# Patient Record
Sex: Male | Born: 1960 | Race: White | Hispanic: No | Marital: Single | Smoking: Never smoker
Health system: Southern US, Community
[De-identification: ages and names within clinical notes are randomized; demographics above are authoritative.]

## PROBLEM LIST (undated history)

## (undated) DIAGNOSIS — F329 Major depressive disorder, single episode, unspecified: Secondary | ICD-10-CM

## (undated) DIAGNOSIS — C760 Malignant neoplasm of head, face and neck: Secondary | ICD-10-CM

## (undated) DIAGNOSIS — K219 Gastro-esophageal reflux disease without esophagitis: Secondary | ICD-10-CM

## (undated) DIAGNOSIS — F32A Depression, unspecified: Secondary | ICD-10-CM

## (undated) DIAGNOSIS — I10 Essential (primary) hypertension: Secondary | ICD-10-CM

## (undated) DIAGNOSIS — T884XXA Failed or difficult intubation, initial encounter: Secondary | ICD-10-CM

## (undated) DIAGNOSIS — C109 Malignant neoplasm of oropharynx, unspecified: Secondary | ICD-10-CM

## (undated) DIAGNOSIS — J189 Pneumonia, unspecified organism: Secondary | ICD-10-CM

## (undated) HISTORY — DX: Malignant neoplasm of oropharynx, unspecified: C10.9

## (undated) HISTORY — PX: TONSILLECTOMY: SUR1361

## (undated) HISTORY — PX: APPENDECTOMY: SHX54

## (undated) HISTORY — PX: RADICAL NECK DISSECTION: SHX2284

## (undated) HISTORY — PX: ADENOIDECTOMY: SUR15

## (undated) HISTORY — DX: Malignant neoplasm of head, face and neck: C76.0

---

## 2007-03-01 ENCOUNTER — Ambulatory Visit: Payer: Self-pay | Admitting: Unknown Physician Specialty

## 2007-03-15 ENCOUNTER — Ambulatory Visit: Payer: Self-pay | Admitting: Oncology

## 2007-04-14 ENCOUNTER — Ambulatory Visit: Payer: Self-pay | Admitting: Oncology

## 2007-05-14 ENCOUNTER — Ambulatory Visit: Payer: Self-pay | Admitting: Oncology

## 2007-07-08 ENCOUNTER — Emergency Department: Payer: Self-pay | Admitting: Emergency Medicine

## 2008-12-14 ENCOUNTER — Ambulatory Visit: Payer: Self-pay | Admitting: Oncology

## 2009-01-07 ENCOUNTER — Ambulatory Visit: Payer: Self-pay | Admitting: Oncology

## 2009-01-11 ENCOUNTER — Ambulatory Visit: Payer: Self-pay | Admitting: Oncology

## 2009-01-26 ENCOUNTER — Ambulatory Visit: Payer: Self-pay | Admitting: Oncology

## 2009-01-27 ENCOUNTER — Ambulatory Visit: Payer: Self-pay | Admitting: Unknown Physician Specialty

## 2009-02-01 ENCOUNTER — Ambulatory Visit: Payer: Self-pay | Admitting: Oncology

## 2009-02-11 ENCOUNTER — Ambulatory Visit: Payer: Self-pay | Admitting: Oncology

## 2009-03-06 ENCOUNTER — Emergency Department: Payer: Self-pay | Admitting: Internal Medicine

## 2009-04-04 ENCOUNTER — Emergency Department: Payer: Self-pay | Admitting: Emergency Medicine

## 2009-05-05 ENCOUNTER — Ambulatory Visit: Payer: Self-pay | Admitting: Unknown Physician Specialty

## 2009-11-03 ENCOUNTER — Emergency Department: Payer: Self-pay | Admitting: Emergency Medicine

## 2009-11-24 ENCOUNTER — Ambulatory Visit: Payer: Self-pay | Admitting: Dermatology

## 2009-12-28 ENCOUNTER — Ambulatory Visit: Payer: Self-pay | Admitting: Podiatry

## 2009-12-30 ENCOUNTER — Ambulatory Visit: Payer: Self-pay | Admitting: Podiatry

## 2010-01-06 ENCOUNTER — Ambulatory Visit: Payer: Self-pay | Admitting: Podiatry

## 2014-10-06 ENCOUNTER — Ambulatory Visit: Payer: Self-pay | Admitting: Internal Medicine

## 2014-10-28 ENCOUNTER — Encounter (INDEPENDENT_AMBULATORY_CARE_PROVIDER_SITE_OTHER): Payer: Self-pay

## 2014-10-28 ENCOUNTER — Encounter: Payer: Self-pay | Admitting: Internal Medicine

## 2014-10-28 ENCOUNTER — Ambulatory Visit (INDEPENDENT_AMBULATORY_CARE_PROVIDER_SITE_OTHER): Payer: BC Managed Care – PPO | Admitting: Internal Medicine

## 2014-10-28 VITALS — BP 128/78 | HR 92 | Temp 98.0°F | Ht 75.0 in | Wt 216.0 lb

## 2014-10-28 DIAGNOSIS — R0689 Other abnormalities of breathing: Secondary | ICD-10-CM

## 2014-10-28 DIAGNOSIS — R0600 Dyspnea, unspecified: Secondary | ICD-10-CM

## 2014-10-28 MED ORDER — ALBUTEROL SULFATE HFA 108 (90 BASE) MCG/ACT IN AERS: 2.0000 | INHALATION_SPRAY | RESPIRATORY_TRACT | Status: DC | PRN

## 2014-10-28 NOTE — Patient Instructions (Signed)
We will order a CT neck and chest, a breathing test and a walk test.  We will send prescription of Albuterol to use 2-4 hours as needed for shortness of breath. We will schedule a 1 month follow up.

## 2014-10-28 NOTE — Progress Notes (Signed)
Date: 10/28/2014  MRN# [id number] Randall Hunter 1961-07-28  Referring Physician: Dr. Delight Ovens Randall Hunter is a 53 y.o. old male seen in consultation for dyspnea on exertion  CC: "short of breath, especially with exercise" Chief Complaint  Patient presents with  . 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 Tami Ribas for shortness of breath. Patient is accompanied by his fiance today. Patient states that he is a fairly active gentleman running and exercising 4 times per week however over the last 4 months this has become more difficult due to shortness of breath with exertion.  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 In 2003 he was noted to have radiation to the left neck and possibly upper lung fields, no radiation in 2014. He states that he has been fairly healthy other than his and malignancy of the neck and left tonsil. In addition to dyspnea on exertion, he also complains of some difficulty swallowing, which he attributes to his 2 neck surgeries and radiation to the neck. Patient states her shortness of breath mostly with exertion, which she describes as inability to get enough air, associated with wheezing, and chest heaviness. His primary care physician did perform a stress echo on him which showed no abnormalities. He also carries a diagnosis of acid reflux for which she is on Protonix 40 mg daily. Shortness of breath occurs mostly with exercising and after exercise the majority of the time over the past 4 months. He was seen by ENT, Dr. Tami Ribas who performed a laryngoscopy that showed sluggish cord movement, not new, left slightly worse than the right. Her level of dyspnea, it  was not felt that the vocal cords were the only reason for dyspnea on exertion, he was referred to pulmonary for further workup and evaluation. He denies any environmental allergies such as dust, pollen, cat dander. 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 He denies any travel to areas with a high prevalence TB or any other airborne diseases. Patient also denies any swelling of the legs and arms. Dr. Tami Ribas prescribed him a trial of Symbicort, which was given on 10/22/2014, currently patient does not feel any significant difference in his breathing. Patient's last neck CT was in 2013, see results as stated below. Patient's major complaint with dyspnea on exertion today is stating that 4 months ago he was able to right and perform exercises on a daily routine without any significant dyspnea currently now performing more than 2 daily chores makes him feel very tired and dyspneic.   PMHX:   Past Medical History  Diagnosis Date  . Oropharyngeal carcinoma     lef sided oropharyngeal SCC s\p resection\radiation  . Malignant tumor of neck     left sided cervical SCC s\p resection\radion, 2014 revision surgery    Surgical Hx:  Past Surgical History  Procedure Laterality Date  . Tonsillectomy    . Adenoidectomy    . Appendectomy    . Radical neck dissection  2002 and 2014    left cervical neck   Family Hx:  History reviewed. No pertinent family history. Social Hx:   History  Substance Use  Topics  . Smoking status: Never Smoker   . Smokeless tobacco: Never Used  . Alcohol Use: No   Medication:   Current Outpatient Rx  Name  Route  Sig  Dispense  Refill  . albuterol (PROVENTIL HFA;VENTOLIN HFA) 108 (90 BASE) MCG/ACT inhaler   Inhalation   Inhale 2 puffs into the lungs every 4 (four) hours as needed for wheezing or shortness of breath.   1 Inhaler   2   . pantoprazole (PROTONIX) 40 MG tablet   Oral   Take 40 mg by mouth 2 (two) times daily.          Marland Kitchen PARoxetine (PAXIL) 20 MG tablet   Oral   Take 20 mg by mouth daily.             Allergies:  Review of patient's allergies indicates no known allergies.  Review of Systems: Gen:  Denies  fever, sweats, chills HEENT: Denies blurred vision, double vision, ear pain, eye pain, hearing loss, nose bleeds, sore throat Cvc:  No dizziness, chest pain or heaviness Resp:   Admits to dyspnea on exertion, and mild intermittent dry cough Gi: Denies swallowing difficulty, stomach pain, nausea or vomiting, diarrhea, constipation, bowel incontinence Gu:  Denies bladder incontinence, burning urine Ext:   No Joint pain, stiffness or swelling Skin: No skin rash, easy bruising or bleeding or hives Endoc:  No polyuria, polydipsia , polyphagia or weight change Psych: No depression, insomnia or hallucinations  Other:  All other systems negative  Physical Examination:   VS: BP 128/78 mmHg  Pulse 92  Temp(Src) 98 F (36.7 C) (Oral)  Ht 6\' 3"  (1.905 m)  Wt 216 lb (97.977 kg)  BMI 27.00 kg/m2  SpO2 96%  General Appearance: No distress  Neuro:without focal findings, mental status, speech normal, alert and oriented, cranial nerves 2-12 intact, reflexes normal and symmetric, sensation grossly normal  HEENT: PERRLA, EOM intact, no ptosis, no other lesions noticed; Mallampati 3 Pulmonary: normal breath sounds., diaphragmatic excursion normal.No wheezing, No rales;   Sputum Production:   CardiovascularNormal S1,S2.  No m/r/g.  Abdominal aorta pulsation normal.    Abdomen: Benign, Soft, non-tender, No masses, hepatosplenomegaly, No lymphadenopathy Renal:  No costovertebral tenderness  GU:  No performed at this time. Endoc: No evident thyromegaly, no signs of acromegaly or Cushing features Skin:   warm, no rashes, no ecchymosis. Left neck with heal scar Extremities: normal, no cyanosis, clubbing, no edema, warm with normal capillary refill. Other findings:none    Rad results:  NECK CT EXAM DATE:  11/22/12 13:12:00  EXAM: Computed tomography, soft tissue neck with contrast material.  DICTATED: 11/22/12 13:31:09  CLINICAL INDICATION: 53 year old (M) with left neck skin cancer surgery  planning. History of a left tonsil squamous cell carcinoma diagnosed in 2012.  He underwent surgery which included a radical tonsillectomy and a radical left  neck dissection , post op radiation in early 2003.   COMPARISON: None available.  TECHNIQUE: Axial 3-mm images from the skull base through the thoracic inlet  after the administration of intravenous contrast. Coronal and sagittal  reformatted images, bone and soft tissue algorithm are provided. For all [facility]  CT exams, radiation dose reduction device (automated exposure control) is used  or manual techniques with radiation dose As Low As Reasonably Achievable  (ALARA) protocol are followed using age and patient-size-specific scan  parameters, while maintaining the necessary diagnostic image quality.  FINDINGS: Disease status post left tonsillectomy and left radical neck  dissection.  The  visualized portions of the brain and the posterior fossa are normal.  The paranasal sinuses, orbits, nasopharynx, oropharynx, and oral cavity are  normal. The salivary glands, including the parotid glands and submandibular  glands, are normal.  The larynx, hypopharynx, and thyroid gland are normal.  There is no lymphadenopathy.  No bone abnormality is demonstrated. There are fibrotic changes noted in the  bilateral lung apices. Normal intravascular enhancement is seen.  INTERPRETATION LOCATION: [place] post left tonsillectomy and radical left neck dissection. No  cervical mass or lymphadenopathy noted.      Stress ECHO 10/16/14 INTERPRETATION Normal Stress Echocardiogram Adequate cardiac workload  Assessment and Plan: Dyspnea and respiratory abnormality Differential diagnosis include: Recurrence of the neck tumor,  focalcord dysfunction, tracheomalacia, radiation fibrosis, obstructive pulmonary disease, restrictive coronary disease  Patient dyspnea on exertion is concerning, especially given normal cardiac stress test. Radiation fibrosis usually occurs in one to 2 years status post last radiation dose, however patient is about 12 years since last radiation dose. While radiation fibrosis is unlikely, cannot be completely excluded from the differential diagnosis.  Tracheomalacia also falls into this category.  Plan - high-resolution CAT scan, with dynamic inspiratory and expiratory views in both supine and recumbent position. - check full PFTs and, 24mwt - albuterol 2 puffs as needed for shortness of breath and wheezing.    Updated Medication List Outpatient Encounter Prescriptions as of 10/28/2014  Medication Sig  . pantoprazole (PROTONIX) 40 MG tablet Take 40 mg by mouth 2 (two) times daily.  Marland Kitchen PARoxetine (PAXIL) 20 MG tablet Take 20 mg by mouth daily.    Orders for this visit: Orders Placed This Encounter  Procedures  . CT Soft Tissue Neck Wo Contrast    Standing Status: Future     Number of Occurrences:      Standing Expiration Date: 01/27/2016    Scheduling Instructions:     Please schedule @ [facility] Dr. Hannah Beat to read.    Order Specific Question:  Reason for Exam (SYMPTOM  OR DIAGNOSIS REQUIRED)    Answer:  cough, sob    Order Specific Question:  Preferred imaging location?    Answer:  [facility]  . CT Chest High Resolution    Standing Status: Future     Number of Occurrences:      Standing Expiration Date: 12/30/2015    Scheduling Instructions:     Schedule High Res CT chest without contrast @ [facility] Dr. Hannah Beat to read. Dx sob, cough    Order Specific Question:  Reason for Exam (SYMPTOM  OR DIAGNOSIS REQUIRED)    Answer:  sob, cough    Order Specific Question:  Preferred imaging location?    Answer:  [facility]  . Pulmonary function test    Standing Status: Future      Number of Occurrences:      Standing Expiration Date: 10/29/2015    Scheduling Instructions:     Scheduled in [place] office 11/10/14    Order Specific Question:  Where should this test be performed?    Answer:  [facility]    Order Specific Question:  Full PFT: includes the following: basic spirometry, spirometry pre & post bronchodilator, diffusion capacity (DLCO), lung volumes    Answer:  Full PFT    Order Specific Question:  MIP/MEP    Answer:  No    Order Specific Question:  6 minute walk    Answer:  Yes    Order Specific Question:  ABG  Answer:  No    Order Specific Question:  Diffusion capacity (DLCO)    Answer:  No    Order Specific Question:  Lung volumes    Answer:  No    Order Specific Question:  Methacholine challenge    Answer:  No     Thank  you for the consultation and for allowing [facility] to assist in the care of your patient. Our recommendations are noted above.  Please contact us if we can be of further service.   Vilinda Boehringer, MD [facility] Office Number: [phone]

## 2014-10-29 ENCOUNTER — Encounter: Payer: Self-pay | Admitting: Internal Medicine

## 2014-10-29 ENCOUNTER — Telehealth: Payer: Self-pay | Admitting: Internal Medicine

## 2014-10-29 MED ORDER — ALBUTEROL SULFATE HFA 108 (90 BASE) MCG/ACT IN AERS: 2.0000 | INHALATION_SPRAY | RESPIRATORY_TRACT | Status: DC | PRN

## 2014-10-29 NOTE — Assessment & Plan Note (Signed)
Differential diagnosis include: Recurrence of the neck tumor, focalcord dysfunction, tracheomalacia, radiation fibrosis, obstructive pulmonary disease, restrictive coronary disease  Patient dyspnea on exertion is concerning, especially given normal cardiac stress test. Radiation fibrosis usually occurs in one to 2 years status post last radiation dose, however patient is about 12 years since last radiation dose. While radiation fibrosis is unlikely, cannot be completely excluded from the differential diagnosis.  Tracheomalacia also falls into this category.  Plan - high-resolution CAT scan, with dynamic inspiratory and expiratory views in both supine and recumbent position. - check full PFTs and, 81mwt - albuterol 2 puffs as needed for shortness of breath and wheezing.

## 2014-10-29 NOTE — Telephone Encounter (Signed)
Called pt. He reports the albuterol was not called in. i have sent this in. Nothing further needed

## 2014-10-30 ENCOUNTER — Telehealth: Payer: Self-pay | Admitting: Internal Medicine

## 2014-10-30 NOTE — Telephone Encounter (Signed)
Spoke with pt, states that he was told by [facility] that he needed a PA for his proventil.  [place], pharmacist states that his insurance requires a PA for any albuterol inhaler, no particular one is covered or recommended.  Had PA form faxed to our office, will look out for this.

## 2014-11-02 NOTE — Telephone Encounter (Signed)
This has not yet been received.

## 2014-11-02 NOTE — Telephone Encounter (Signed)
Caryl Pina please advise if you have received this form for the PA to be done. thanks

## 2014-11-04 ENCOUNTER — Ambulatory Visit: Payer: Self-pay | Admitting: Internal Medicine

## 2014-11-04 NOTE — Telephone Encounter (Signed)
Called [employer] and asked them to send form for PA.  Received fax, given to [facility] to put in Dr. Merian Capron file for signature.

## 2014-11-04 NOTE — Telephone Encounter (Signed)
Dr. Stevenson Clinch I have PA form for completion for patient Albuterol. [employer] will not cover Proventil, would you consider switching to another medication or wait until Monday?

## 2014-11-08 NOTE — Telephone Encounter (Signed)
Ventolin is okay, if ins will cover

## 2014-11-09 ENCOUNTER — Other Ambulatory Visit: Payer: Self-pay | Admitting: Internal Medicine

## 2014-11-09 DIAGNOSIS — R0689 Other abnormalities of breathing: Principal | ICD-10-CM

## 2014-11-09 DIAGNOSIS — R0600 Dyspnea, unspecified: Secondary | ICD-10-CM

## 2014-11-09 MED ORDER — ALBUTEROL SULFATE (2.5 MG/3ML) 0.083% IN NEBU: INHALATION_SOLUTION | RESPIRATORY_TRACT | Status: DC

## 2014-11-09 NOTE — Telephone Encounter (Signed)
Patient scheduled for PFT tomorrow, script for nebulizer printed and will be given to patient tomorrow. Prescription for albuterol for neb sent to patient pharmacy. I will talk with him about the importance of having a rescue inhaler on hand.

## 2014-11-09 NOTE — Telephone Encounter (Signed)
Nebulizer albuterol is okay. 1neb treatment q2-4hrs, prn sob\wheezing.  Please note that if the patient is out of his home and experiences sob\wheezing, he will need a rescue inhaler.  I understand that it is expensive, but it is a recommendation that I endorse highly (again, only as a rescue inhaler, 2puff q35mins prn sob\wheezing, max 3 consecutive uses, if no relief then seek medical assistance).

## 2014-11-09 NOTE — Telephone Encounter (Signed)
Spoke with pharmacist at [facility], ventolin and proventil are not covered by pt's insurance.  Proair is covered but will cost pt $52 per inhaler.  Pharmacist notes another option for pt is a nebulized albuterol, being covered at $4/box.    Dr. Stevenson Clinch please advise.  Thanks!

## 2014-11-10 ENCOUNTER — Ambulatory Visit (INDEPENDENT_AMBULATORY_CARE_PROVIDER_SITE_OTHER): Payer: BLUE CROSS/BLUE SHIELD | Admitting: Internal Medicine

## 2014-11-10 ENCOUNTER — Ambulatory Visit: Payer: BC Managed Care – PPO | Admitting: Internal Medicine

## 2014-11-10 ENCOUNTER — Other Ambulatory Visit: Payer: BLUE CROSS/BLUE SHIELD

## 2014-11-10 DIAGNOSIS — R0600 Dyspnea, unspecified: Secondary | ICD-10-CM

## 2014-11-10 DIAGNOSIS — R0689 Other abnormalities of breathing: Secondary | ICD-10-CM

## 2014-11-10 LAB — PULMONARY FUNCTION TEST
DL/VA % pred: 83 %
DL/VA: 4.09 ml/min/mmHg/L
DLCO unc % pred: 76 %
DLCO unc: 30.07 ml/min/mmHg
FEF 25-75 POST: 3.39 L/s
FEF 25-75 PRE: 4.1 L/s
FEF2575-%CHANGE-POST: -17 %
FEF2575-%Pred-Post: 92 %
FEF2575-%Pred-Pre: 111 %
FEV1-%CHANGE-POST: -6 %
FEV1-%PRED-PRE: 96 %
FEV1-%Pred-Post: 90 %
FEV1-PRE: 4.27 L
FEV1-Post: 3.97 L
FEV1FVC-%Change-Post: -8 %
FEV1FVC-%Pred-Pre: 100 %
FEV6-%CHANGE-POST: 2 %
FEV6-%PRED-POST: 101 %
FEV6-%Pred-Pre: 99 %
FEV6-POST: 5.62 L
FEV6-Pre: 5.5 L
FEV6FVC-%Change-Post: 0 %
FEV6FVC-%Pred-Post: 104 %
FEV6FVC-%Pred-Pre: 103 %
FVC-%CHANGE-POST: 1 %
FVC-%PRED-POST: 97 %
FVC-%Pred-Pre: 95 %
FVC-POST: 5.62 L
PRE FEV1/FVC RATIO: 77 %
Post FEV1/FVC ratio: 71 %
Post FEV6/FVC ratio: 100 %
Pre FEV6/FVC Ratio: 100 %

## 2014-11-10 NOTE — Progress Notes (Signed)
PFT done today. 

## 2014-11-26 ENCOUNTER — Ambulatory Visit: Payer: BC Managed Care – PPO | Admitting: Internal Medicine

## 2014-11-26 ENCOUNTER — Encounter: Payer: Self-pay | Admitting: Internal Medicine

## 2014-11-26 ENCOUNTER — Ambulatory Visit (INDEPENDENT_AMBULATORY_CARE_PROVIDER_SITE_OTHER): Payer: BLUE CROSS/BLUE SHIELD | Admitting: Internal Medicine

## 2014-11-26 ENCOUNTER — Telehealth: Payer: Self-pay | Admitting: *Deleted

## 2014-11-26 VITALS — BP 126/84 | HR 73 | Ht 75.0 in | Wt 219.0 lb

## 2014-11-26 DIAGNOSIS — R0600 Dyspnea, unspecified: Secondary | ICD-10-CM

## 2014-11-26 DIAGNOSIS — R0689 Other abnormalities of breathing: Secondary | ICD-10-CM

## 2014-11-26 NOTE — Assessment & Plan Note (Signed)
Differential diagnosis include: Recurrence of the neck tumor, focalcord dysfunction, tracheomalacia, radiation fibrosis, obstructive pulmonary disease, restrictive coronary disease  Patient dyspnea on exertion is concerning, especially given normal cardiac stress test. Radiation fibrosis usually occurs in one to 2 years status post last radiation dose, however patient is about 12 years since last radiation dose. While radiation fibrosis is unlikely, cannot be completely excluded from the differential diagnosis.  Tracheomalacia also falls into this category.  CT of chest and neck in December 2015 showed no recurrence of tumor or metastasis to the lungs. However, there was noted to be biapical pleural thickening architectural distortion consistent with chronic postradiation changes.  PFTs also showed an obstructive process, however, there was mild to moderate restriction.  Plan: -Given that he is having dyspnea with exertion and no recurrence of tumor or no lung fibrosis on imaging,tracheomalacia is still high in differential. We discussed fiberoptic bronchoscopy at today's visit to evaluate for live evaluation of the trachea during the respiratory cycle and any dynamic collapse during expiration. Patient is in agreement with bronchoscopy, also discussed if there is any endobronchial lesions noted the bronchoscopy will plan for fine needle biopsy. -Continue with as needed albuterol nebulizer for shortness of breath. -Is no significant dynamic collapse as noted on fiberoptic bronchoscopy will then speak to his ENT physician about seeing a laryngeal/vocal cord specialist.

## 2014-11-26 NOTE — Progress Notes (Signed)
MRN# [id number] Randall Hunter 1961-09-08   CC: Chief Complaint  Patient presents with  . Follow-up    Pt only has nebulizer txt. His ins will not cover medications prescribed. He is not able to take a breathing txt every 2-4 hours and has not seen a change in his breathing after a txt. He is still having lots of sob.      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 Tami Hunter for shortness of breath. Patient states that he is a fairly active gentleman running and exercising 4 times per week however over the last 4 months this has become more difficult due to shortness of breath with exertion. 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 In 2003 he was noted to have radiation to the left neck and possibly upper lung fields, no radiation in 2014.  Repeat CT Chest and Neck with no cancer recurrence or metastasis to the lungs, did note to have b\l apical fibrosis (mild) from prior radiation   Events since last clinic visit: Patient is a pleasant 54 year old male presents today for followup visit of shortness of breath with exertion, he is accompanied by his spouse. Briefly, patient is a history of squamous cell carcinoma of the neck status post resection and radiation in 2002 in 2003, with tumor recurrence in the left neck and revision surgery in 2014. Today patient states that his shortness of breath is still present but has not gotten worse since his last visit, again he is noted to have shortness of breath mostly with exertion. At his last visit the plan was to have pulmonary function testing CT chest, CT neck and followup visit. Today patient request the results of his studies.    PMHX:   Past  Medical History  Diagnosis Date  . Oropharyngeal carcinoma     lef sided oropharyngeal SCC s\p resection\radiation  . Malignant tumor of neck     left sided cervical SCC s\p resection\radion, 2014 revision surgery    Surgical Hx:  Past Surgical History  Procedure Laterality Date  . Tonsillectomy    . Adenoidectomy    . Appendectomy    . Radical neck dissection  2002 and 2014    left cervical neck   Family Hx:  No family history on file. Social Hx:   History  Substance Use Topics  . Smoking status: Never Smoker   . Smokeless tobacco: Never Used  . Alcohol Use: No   Medication:   Current Outpatient Rx  Name  Route  Sig  Dispense  Refill  . albuterol (PROVENTIL) (2.5 MG/3ML) 0.083% nebulizer solution      Take 2.5 MG/ML every 2-4 hours prn sob/wheezing   75 mL   12   . pantoprazole (PROTONIX) 40 MG tablet   Oral   Take 40 mg by mouth 2 (two) times daily.         Marland Kitchen PARoxetine (PAXIL) 20 MG tablet   Oral   Take 20 mg by mouth daily.            Review of Systems: Gen:  Denies  fever, sweats, chills HEENT: Denies blurred vision, double vision, ear pain, eye pain, hearing loss, nose bleeds,  sore throat Cvc:  No dizziness, chest pain or heaviness Resp: shortness of breath not worst from last visit, mostly with exertion Gi: Denies swallowing difficulty, stomach pain, nausea or vomiting, diarrhea, constipation, bowel incontinence Gu:  Denies bladder incontinence, burning urine Ext:   No Joint pain, stiffness or swelling Skin: No skin rash, easy bruising or bleeding or hives Endoc:  No polyuria, polydipsia , polyphagia or weight change Psych: No depression, insomnia or hallucinations  Other:  All other systems negative  Allergies:  Review of patient's allergies indicates no known allergies.  Physical Examination:  VS: BP 126/84 mmHg  Pulse 73  Ht 6\' 3"  (1.905 m)  Wt 219 lb (99.338 kg)  BMI 27.37 kg/m2  SpO2 98%  General Appearance: No distress  HEENT:  PERRLA, EOM intact, no ptosis, no other lesions noticed Pulmonary:Exam: normal breath sounds., diaphragmatic excursion normal.No wheezing, No rales   Cardiovascular:@ Exam:  Normal S1,S2.  No m/r/g.     Abdomen:Exam: Benign, Soft, non-tender, No masses  Skin:   warm, no rashes, no ecchymosis  Extremities: normal, no cyanosis, clubbing, no edema, warm with normal capillary refill.   Labs results:  BMP No results found for: NA, K, CL, CO2, GLUCOSE, BUN, CREATININE   CBC No flowsheet data found.   Rad results:  (The following images and results were reviewed by Dr. Stevenson Hunter). CT Neck 11/04/14 1. No evidence of recurrent mass or enlarged lymph nodes in the neck allowing for lack of IV contrast. 2. Submandibular gland and left sternocleidomastoid muscle are absent. There is fatty infiltration of the right submandibular gland, possibly related to prior radiation therapy.   CT Chest 11/04/14 1. No findings to suggest metastatic disease to the thorax at this time. 2. no findings to suggest interstitial lung disease.  3. mild postradiation changes in the apices of the lungs bilaterally and slightly noted. 4.thickening years of groundglass attenuation, subpleural reticulation, parenchymal banding, traction bronchiectasis or frank honeycombing to suggest interstitial lung disease. Inspiratory and expiratory imaging is unremarkable.   Assessment and Plan: Dyspnea and respiratory abnormality Differential diagnosis include: Recurrence of the neck tumor, focalcord dysfunction, tracheomalacia, radiation fibrosis, obstructive pulmonary disease, restrictive coronary disease  Patient dyspnea on exertion is concerning, especially given normal cardiac stress test. Radiation fibrosis usually occurs in one to 2 years status post last radiation dose, however patient is about 12 years since last radiation dose. While radiation fibrosis is unlikely, cannot be completely excluded from the differential diagnosis.   Tracheomalacia also falls into this category.  CT of chest and neck in December 2015 showed no recurrence of tumor or metastasis to the lungs. However, there was noted to be biapical pleural thickening architectural distortion consistent with chronic postradiation changes.  PFTs also showed an obstructive process, however, there was mild to moderate restriction.  Plan: -Given that he is having dyspnea with exertion and no recurrence of tumor or no lung fibrosis on imaging,tracheomalacia is still high in differential. We discussed fiberoptic bronchoscopy at today's visit to evaluate for live evaluation of the trachea during the respiratory cycle and any dynamic collapse during expiration. Patient is in agreement with bronchoscopy, also discussed if there is any endobronchial lesions noted the bronchoscopy will plan for fine needle biopsy. -Continue with as needed albuterol nebulizer for shortness of breath. -Is no significant dynamic collapse as noted on fiberoptic bronchoscopy will then speak to his ENT physician about seeing a laryngeal/vocal cord specialist.      Updated Medication List Outpatient Encounter Prescriptions as of  11/26/2014  Medication Sig  . albuterol (PROVENTIL) (2.5 MG/3ML) 0.083% nebulizer solution Take 2.5 MG/ML every 2-4 hours prn sob/wheezing  . pantoprazole (PROTONIX) 40 MG tablet Take 40 mg by mouth 2 (two) times daily.  Marland Kitchen PARoxetine (PAXIL) 20 MG tablet Take 20 mg by mouth daily.  . [DISCONTINUED] albuterol (PROVENTIL HFA;VENTOLIN HFA) 108 (90 BASE) MCG/ACT inhaler Inhale 2 puffs into the lungs every 4 (four) hours as needed for wheezing or shortness of breath. (Patient not taking: Reported on 11/26/2014)    Orders for this visit: No orders of the defined types were placed in this encounter.    Thank  you for the visitation and for allowing  [facility] to assist in the care of your patient. Our recommendations are noted above.  Please contact  us if we can be of further service.  Vilinda Boehringer, MD [facility] Office Number: [phone]

## 2014-11-26 NOTE — Patient Instructions (Signed)
We will call you to set up Bronch at [facility]. We will then set up a 2 week follow up after the procedure.

## 2014-11-26 NOTE — Telephone Encounter (Signed)
Called patient with appt info. Bronchoscopy scheduled for next Tuesday 12/01/14 at 10 am. Pt is to stop asa 5 days prior if taking and npo after midnight. Pt will need 2 week f/u appt. He was driving so I will put in recall.

## 2014-12-01 ENCOUNTER — Ambulatory Visit: Payer: Self-pay | Admitting: Internal Medicine

## 2014-12-01 DIAGNOSIS — R0602 Shortness of breath: Secondary | ICD-10-CM

## 2014-12-22 ENCOUNTER — Ambulatory Visit: Payer: BLUE CROSS/BLUE SHIELD | Admitting: Internal Medicine

## 2015-01-11 ENCOUNTER — Ambulatory Visit: Payer: BLUE CROSS/BLUE SHIELD | Admitting: Internal Medicine

## 2015-01-11 ENCOUNTER — Encounter: Payer: Self-pay | Admitting: Internal Medicine

## 2015-10-22 ENCOUNTER — Other Ambulatory Visit: Payer: Self-pay | Admitting: Internal Medicine

## 2015-10-22 ENCOUNTER — Ambulatory Visit
Admission: RE | Admit: 2015-10-22 | Discharge: 2015-10-22 | Disposition: A | Discharge status: Home / Self Care | Payer: No Typology Code available for payment source | Source: Ambulatory Visit | Attending: Internal Medicine | Admitting: Internal Medicine

## 2015-10-22 DIAGNOSIS — R079 Chest pain, unspecified: Secondary | ICD-10-CM | POA: Diagnosis present

## 2015-10-22 DIAGNOSIS — Z9889 Other specified postprocedural states: Secondary | ICD-10-CM

## 2015-10-22 DIAGNOSIS — R070 Pain in throat: Secondary | ICD-10-CM

## 2015-12-31 IMAGING — CR DG NECK SOFT TISSUE
1 series · 3 of 3 positions shown · non-contrast
Comparison: CT scan of November 04, 2014.

CLINICAL DATA: Throat pain.

EXAM:
NECK SOFT TISSUES - 1+ VIEW

[Series 1: dg neck soft tissue · 0.14mm/px · 3 of 3 slices shown]
[im 1/3]
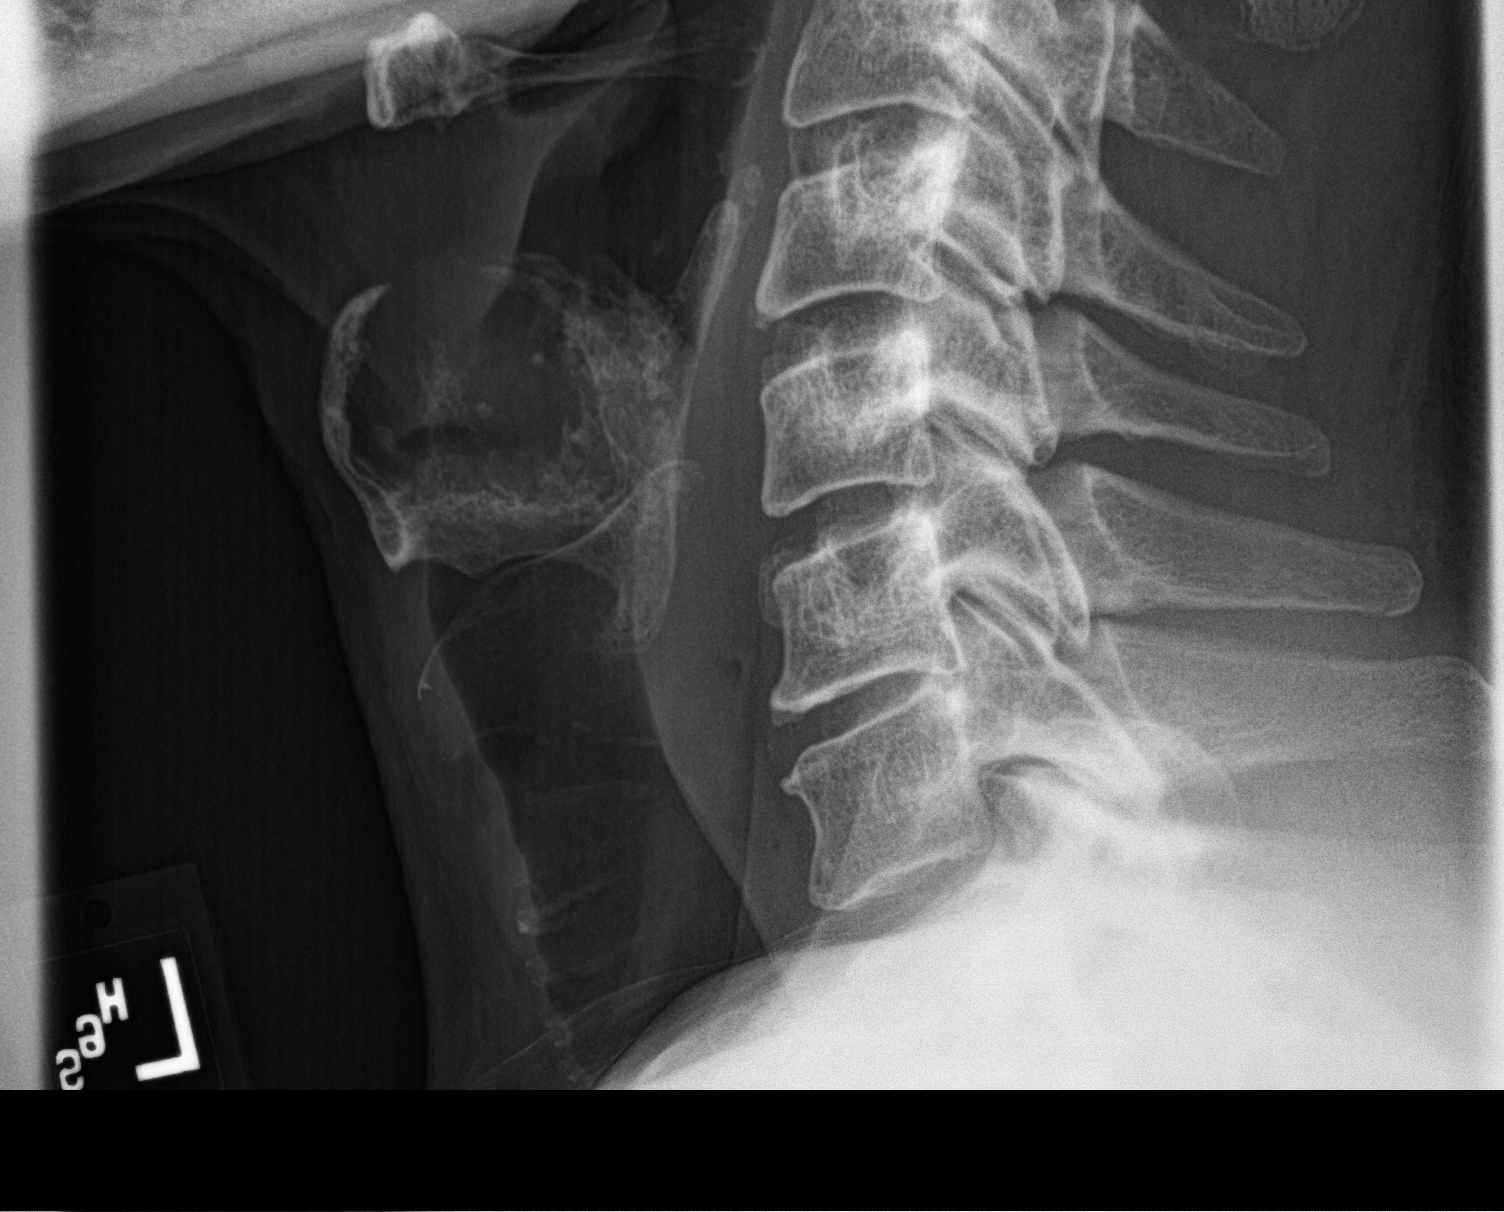
[im 2/3]
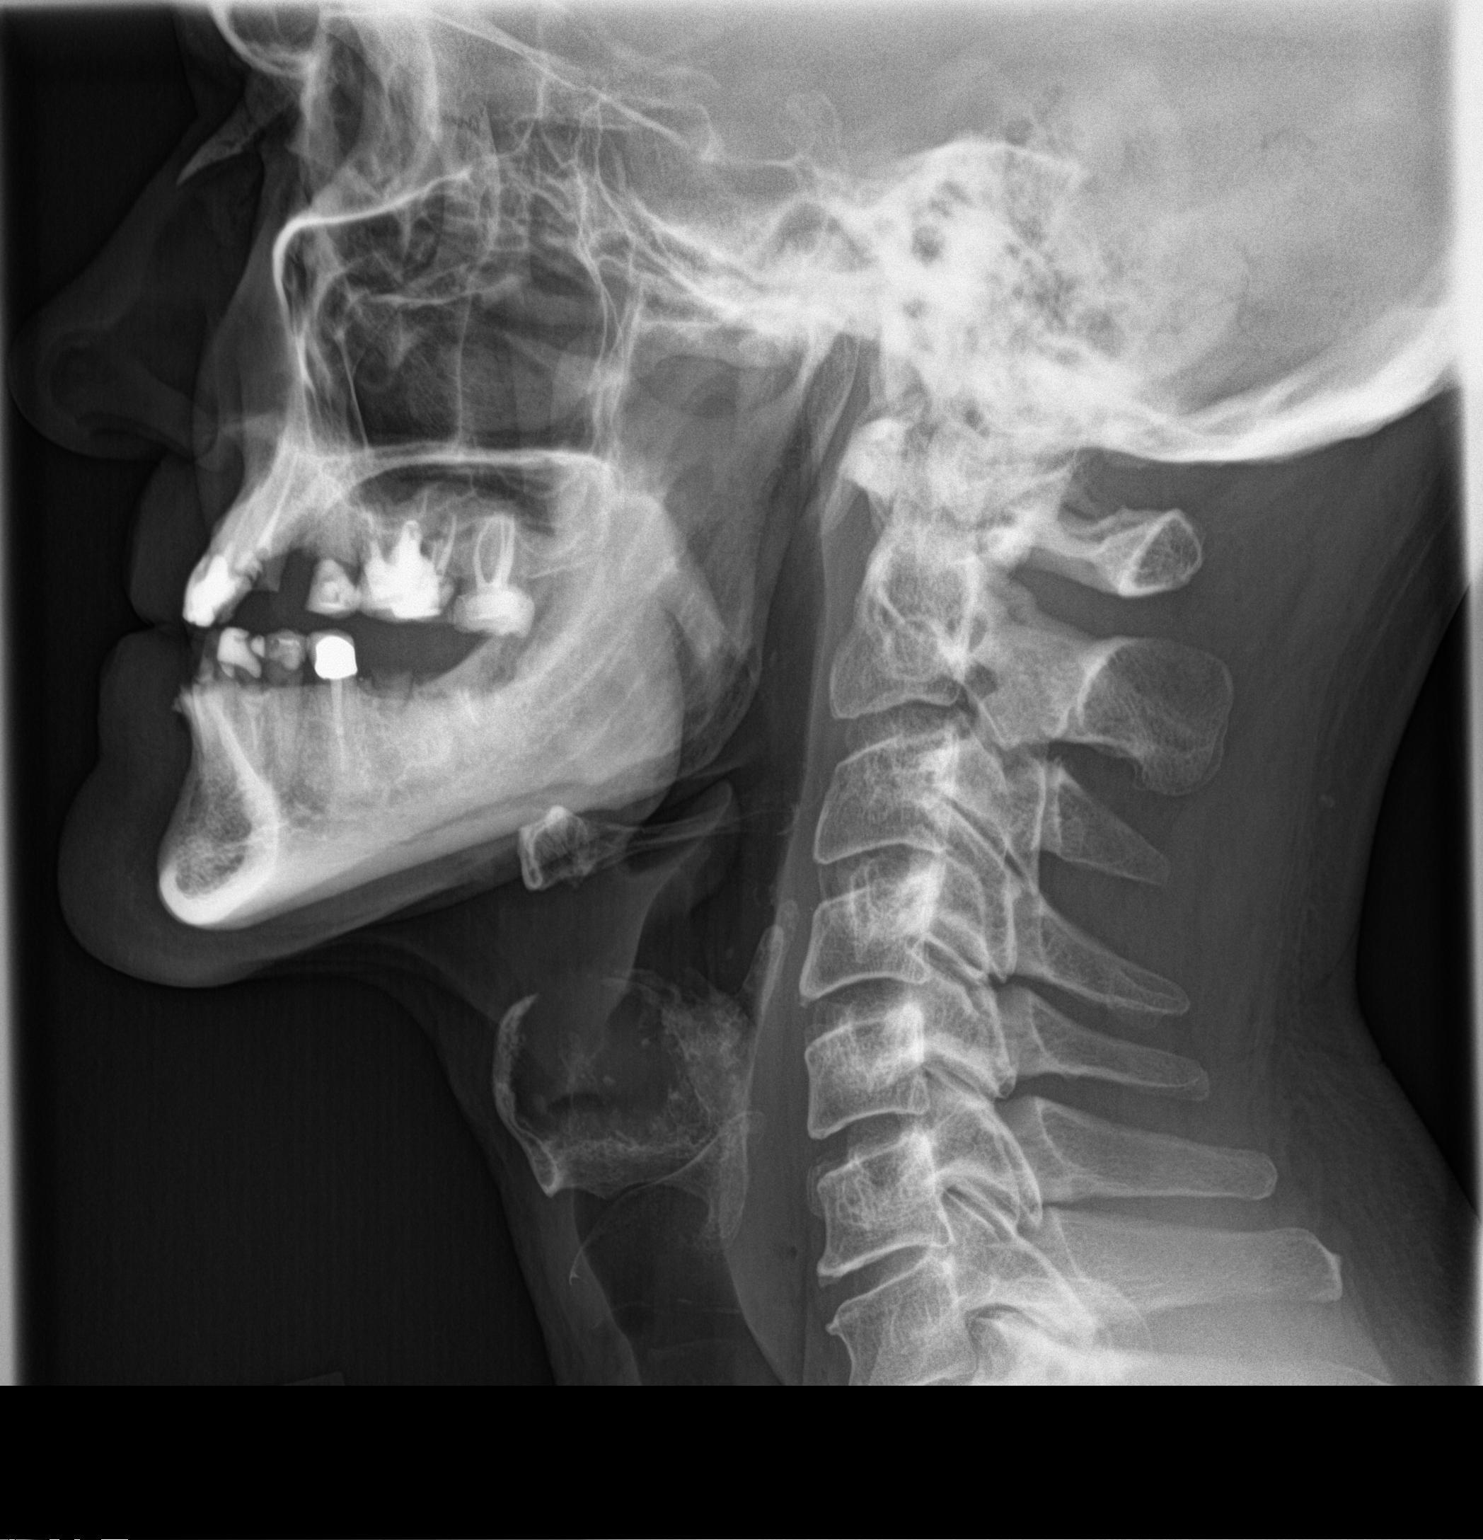
[im 3/3]
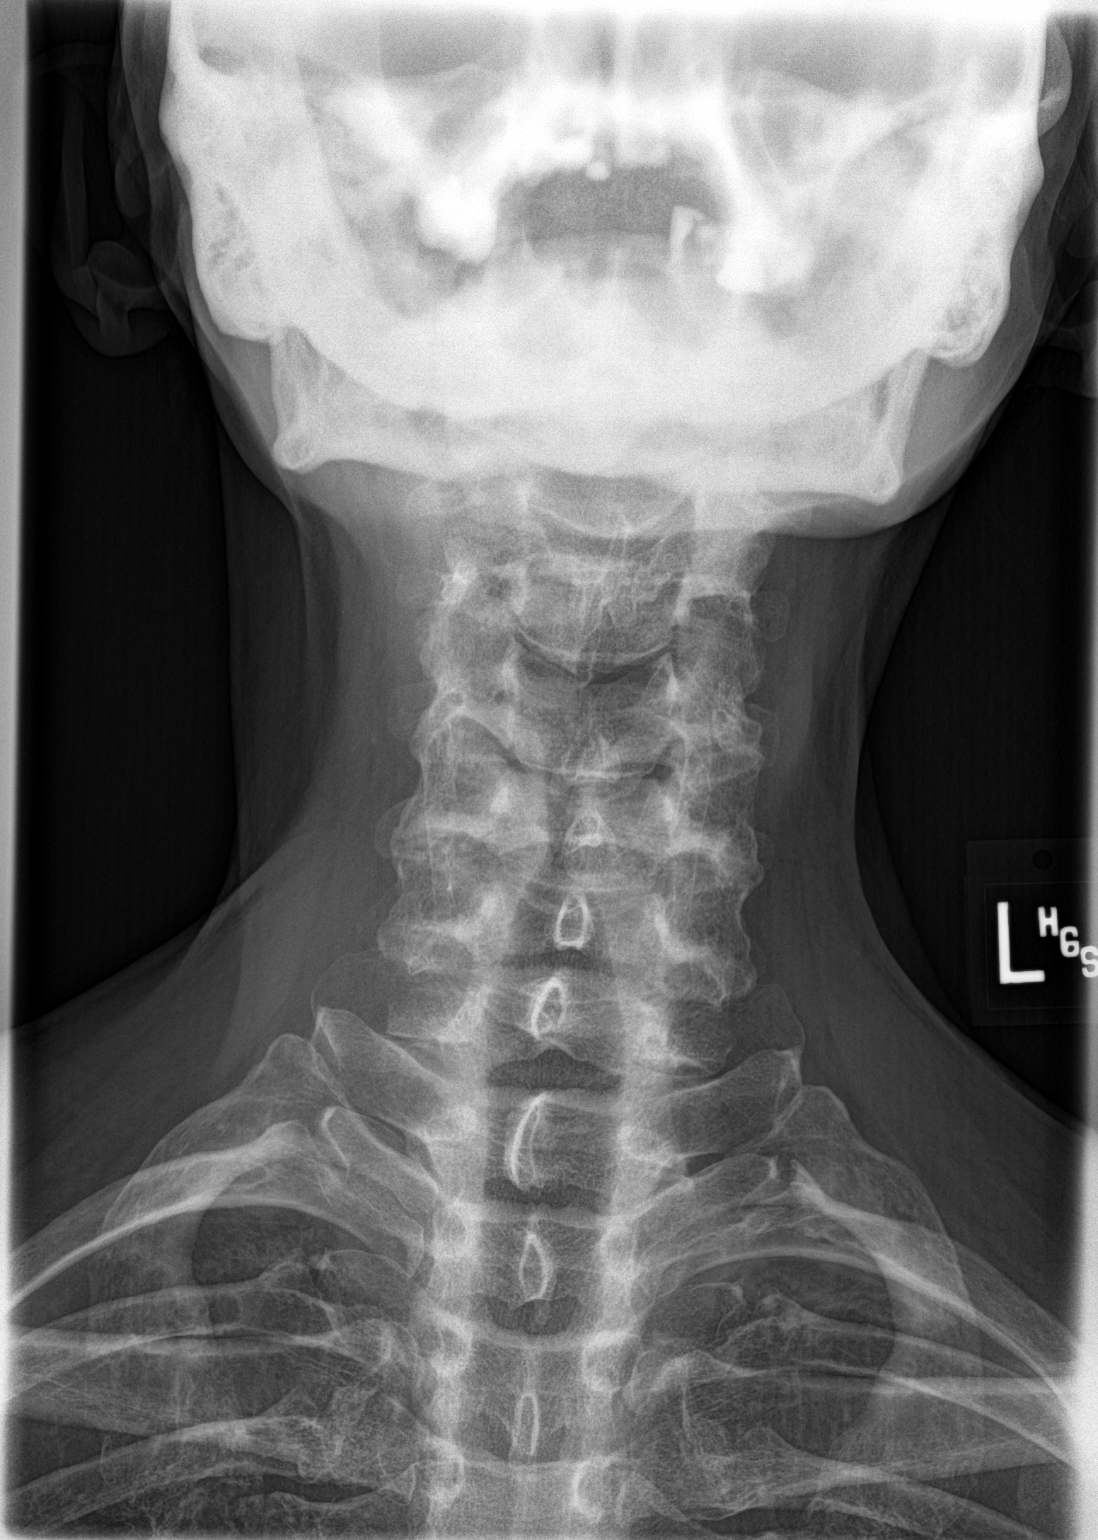

[3 of 3 positions shown; findings below may reference images not displayed]

FINDINGS: There is no evidence of retropharyngeal soft tissue swelling or
epiglottic enlargement. The cervical airway is unremarkable and no
radio-opaque foreign body identified.
IMPRESSION: No abnormality seen in the soft tissues of the neck.

## 2016-08-22 ENCOUNTER — Other Ambulatory Visit: Payer: Self-pay | Admitting: Internal Medicine

## 2016-08-22 ENCOUNTER — Ambulatory Visit
Admission: RE | Admit: 2016-08-22 | Discharge: 2016-08-22 | Disposition: A | Discharge status: Home / Self Care | Payer: BLUE CROSS/BLUE SHIELD | Source: Ambulatory Visit | Attending: Internal Medicine | Admitting: Internal Medicine

## 2016-08-22 DIAGNOSIS — R519 Headache, unspecified: Secondary | ICD-10-CM

## 2016-08-22 DIAGNOSIS — R51 Headache: Principal | ICD-10-CM

## 2016-08-22 DIAGNOSIS — I10 Essential (primary) hypertension: Secondary | ICD-10-CM

## 2016-11-29 ENCOUNTER — Encounter (INDEPENDENT_AMBULATORY_CARE_PROVIDER_SITE_OTHER): Payer: BLUE CROSS/BLUE SHIELD

## 2016-12-22 ENCOUNTER — Encounter (INDEPENDENT_AMBULATORY_CARE_PROVIDER_SITE_OTHER): Payer: BLUE CROSS/BLUE SHIELD

## 2017-02-06 ENCOUNTER — Other Ambulatory Visit: Payer: Self-pay | Admitting: Physician Assistant

## 2017-02-06 DIAGNOSIS — R42 Dizziness and giddiness: Secondary | ICD-10-CM

## 2017-02-06 DIAGNOSIS — R251 Tremor, unspecified: Secondary | ICD-10-CM

## 2017-02-21 ENCOUNTER — Ambulatory Visit
Admission: RE | Admit: 2017-02-21 | Discharge: 2017-02-21 | Disposition: A | Discharge status: Home / Self Care | Payer: BLUE CROSS/BLUE SHIELD | Source: Ambulatory Visit | Attending: Physician Assistant | Admitting: Physician Assistant

## 2017-02-21 DIAGNOSIS — R51 Headache: Secondary | ICD-10-CM | POA: Diagnosis not present

## 2017-02-21 DIAGNOSIS — C4492 Squamous cell carcinoma of skin, unspecified: Secondary | ICD-10-CM | POA: Insufficient documentation

## 2017-02-21 DIAGNOSIS — R42 Dizziness and giddiness: Secondary | ICD-10-CM | POA: Diagnosis present

## 2017-02-21 DIAGNOSIS — R251 Tremor, unspecified: Secondary | ICD-10-CM | POA: Diagnosis not present

## 2017-02-21 DIAGNOSIS — R9082 White matter disease, unspecified: Secondary | ICD-10-CM | POA: Diagnosis not present

## 2017-02-21 LAB — POCT I-STAT CREATININE: Creatinine, Ser: 1.1 mg/dL (ref 0.61–1.24)

## 2017-02-21 MED ORDER — GADOBENATE DIMEGLUMINE 529 MG/ML IV SOLN
20.0000 mL | Freq: Once | INTRAVENOUS | Status: AC | PRN
  Administered 2017-02-21: 20 mL via INTRAVENOUS

## 2017-05-08 ENCOUNTER — Other Ambulatory Visit (INDEPENDENT_AMBULATORY_CARE_PROVIDER_SITE_OTHER): Payer: Self-pay

## 2017-05-08 ENCOUNTER — Other Ambulatory Visit (INDEPENDENT_AMBULATORY_CARE_PROVIDER_SITE_OTHER): Payer: Self-pay | Admitting: Unknown Physician Specialty

## 2017-05-08 DIAGNOSIS — C109 Malignant neoplasm of oropharynx, unspecified: Secondary | ICD-10-CM

## 2017-12-12 ENCOUNTER — Other Ambulatory Visit: Payer: Self-pay | Admitting: Internal Medicine

## 2017-12-12 ENCOUNTER — Ambulatory Visit
Admission: RE | Admit: 2017-12-12 | Discharge: 2017-12-12 | Disposition: A | Discharge status: Home / Self Care | Payer: BLUE CROSS/BLUE SHIELD | Source: Ambulatory Visit | Attending: Internal Medicine | Admitting: Internal Medicine

## 2017-12-12 DIAGNOSIS — M5021 Other cervical disc displacement,  high cervical region: Secondary | ICD-10-CM | POA: Insufficient documentation

## 2017-12-12 DIAGNOSIS — M4802 Spinal stenosis, cervical region: Secondary | ICD-10-CM | POA: Insufficient documentation

## 2017-12-12 DIAGNOSIS — M5010 Cervical disc disorder with radiculopathy, unspecified cervical region: Secondary | ICD-10-CM

## 2017-12-24 ENCOUNTER — Ambulatory Visit
Admission: RE | Admit: 2017-12-24 | Discharge: 2017-12-24 | Disposition: A | Discharge status: Home / Self Care | Payer: BLUE CROSS/BLUE SHIELD | Source: Ambulatory Visit | Attending: Student in an Organized Health Care Education/Training Program | Admitting: Student in an Organized Health Care Education/Training Program

## 2017-12-24 ENCOUNTER — Other Ambulatory Visit: Payer: Self-pay

## 2017-12-24 ENCOUNTER — Encounter: Payer: Self-pay | Admitting: Student in an Organized Health Care Education/Training Program

## 2017-12-24 ENCOUNTER — Ambulatory Visit (HOSPITAL_BASED_OUTPATIENT_CLINIC_OR_DEPARTMENT_OTHER): Payer: BLUE CROSS/BLUE SHIELD | Admitting: Student in an Organized Health Care Education/Training Program

## 2017-12-24 VITALS — BP 112/77 | HR 77 | Temp 98.3°F | Resp 15 | Ht 74.0 in | Wt 205.0 lb

## 2017-12-24 DIAGNOSIS — M4722 Other spondylosis with radiculopathy, cervical region: Secondary | ICD-10-CM

## 2017-12-24 DIAGNOSIS — Z7982 Long term (current) use of aspirin: Secondary | ICD-10-CM | POA: Diagnosis not present

## 2017-12-24 DIAGNOSIS — R251 Tremor, unspecified: Secondary | ICD-10-CM | POA: Insufficient documentation

## 2017-12-24 DIAGNOSIS — M25511 Pain in right shoulder: Secondary | ICD-10-CM | POA: Diagnosis present

## 2017-12-24 DIAGNOSIS — Z85818 Personal history of malignant neoplasm of other sites of lip, oral cavity, and pharynx: Secondary | ICD-10-CM | POA: Insufficient documentation

## 2017-12-24 DIAGNOSIS — M50123 Cervical disc disorder at C6-C7 level with radiculopathy: Secondary | ICD-10-CM | POA: Diagnosis not present

## 2017-12-24 DIAGNOSIS — M5412 Radiculopathy, cervical region: Secondary | ICD-10-CM

## 2017-12-24 DIAGNOSIS — M4802 Spinal stenosis, cervical region: Secondary | ICD-10-CM | POA: Insufficient documentation

## 2017-12-24 DIAGNOSIS — M5010 Cervical disc disorder with radiculopathy, unspecified cervical region: Secondary | ICD-10-CM

## 2017-12-24 DIAGNOSIS — Z79899 Other long term (current) drug therapy: Secondary | ICD-10-CM | POA: Diagnosis not present

## 2017-12-24 DIAGNOSIS — G8929 Other chronic pain: Secondary | ICD-10-CM

## 2017-12-24 MED ORDER — SODIUM CHLORIDE 0.9% FLUSH
1.0000 mL | Freq: Once | INTRAVENOUS | Status: AC
  Administered 2017-12-24: 1 mL

## 2017-12-24 MED ORDER — ROPIVACAINE HCL 2 MG/ML IJ SOLN
1.0000 mL | Freq: Once | INTRAMUSCULAR | Status: AC
  Administered 2017-12-24: 1 mL via EPIDURAL

## 2017-12-24 MED ORDER — IOPAMIDOL (ISOVUE-M 200) INJECTION 41%
10.0000 mL | Freq: Once | INTRAMUSCULAR | Status: AC
  Administered 2017-12-24: 10 mL via EPIDURAL
  Filled 2017-12-24: qty 10

## 2017-12-24 MED ORDER — DEXAMETHASONE SODIUM PHOSPHATE 10 MG/ML IJ SOLN
INTRAMUSCULAR | Status: AC
  Filled 2017-12-24: qty 1

## 2017-12-24 MED ORDER — LACTATED RINGERS IV SOLN
1000.0000 mL | Freq: Once | INTRAVENOUS | Status: AC
  Administered 2017-12-24: 1000 mL via INTRAVENOUS

## 2017-12-24 MED ORDER — SODIUM CHLORIDE 0.9 % IJ SOLN
INTRAMUSCULAR | Status: AC
  Filled 2017-12-24: qty 10

## 2017-12-24 MED ORDER — ROPIVACAINE HCL 2 MG/ML IJ SOLN
INTRAMUSCULAR | Status: AC
  Filled 2017-12-24: qty 10

## 2017-12-24 MED ORDER — DEXAMETHASONE SODIUM PHOSPHATE 10 MG/ML IJ SOLN: 10.0000 mg | Freq: Once | INTRAMUSCULAR | Status: DC

## 2017-12-24 MED ORDER — LIDOCAINE HCL (PF) 1 % IJ SOLN
INTRAMUSCULAR | Status: AC
  Filled 2017-12-24: qty 5

## 2017-12-24 MED ORDER — DEXAMETHASONE SODIUM PHOSPHATE 10 MG/ML IJ SOLN
10.0000 mg | Freq: Once | INTRAMUSCULAR | Status: AC
  Administered 2017-12-24: 10 mg

## 2017-12-24 NOTE — Progress Notes (Signed)
Safety precautions to be maintained throughout the outpatient stay will include: orient to surroundings, keep bed in low position, maintain call bell within reach at all times, provide assistance with transfer out of bed and ambulation.  

## 2017-12-24 NOTE — Progress Notes (Signed)
Patient's Name: Randall Hunter  MRN: [id number]  Referring Provider: Meade Maw, MD  DOB: 1961/10/07  PCP: Rusty Aus, MD  DOS: 12/24/2017  Note by: Gillis Santa, MD  Service setting: Ambulatory outpatient  Specialty: Interventional Pain Management  Location: [facility] (AMB) Pain Management Facility    Patient type: New patient ("FAST-TRACK" Evaluation)   Warning: This referral option does not include the extensive pharmacological evaluation required for [place] to take over the patient's medication management. The "Fast-Track" system is designed to bypass the new patient referral waiting list, as well as the normal patient evaluation process, in order to provide a patient in distress with a timely pain management intervention. Because the system was not designed to unfairly get a patient into our pain practice ahead of those already waiting, certain restrictions apply. By requesting a "Fast-Track" consult, the referring physician has opted to continue managing the patient's medications in order to get interventional urgent care.  Primary Reason for Visit: Interventional Pain Management Treatment. CC: Shoulder Pain (right)   Procedure  HPI  [name] is a 57 y.o. year old, male patient, who comes today for a  "Fast-Track" new patient evaluation, as requested by Meade Maw, MD. The patient has been made aware that this type of referral option is reserved for the Interventional Pain Management portion of our practice and completely excludes the option of medication management. His primarily concern today is the Shoulder Pain (right)  Pain Assessment: Location: Right Shoulder Radiating: pt states pain originated in right periscapula area, moved into right shoulder and down right arm to all fingers Onset: More than a month ago Duration: Chronic pain Quality: Aching, Constant Severity: 8 /10 (self-reported pain score)  Note: Reported level is inconsistent with clinical observations.  Clinically the patient looks like a 3/10 A 3/10 is viewed as "Moderate" and described as significantly interfering with activities of daily living (ADL). It becomes difficult to feed, bathe, get dressed, get on and off the toilet or to perform personal hygiene functions. Difficult to get in and out of bed or a chair without assistance. Very distracting. With effort, it can be ignored when deeply involved in activities.       When using our objective Pain Scale, levels between 6 and 10/10 are said to belong in an emergency room, as it progressively worsens from a 6/10, described as severely limiting, requiring emergency care not usually available at an outpatient pain management facility. At a 6/10 level, communication becomes difficult and requires great effort. Assistance to reach the emergency department may be required. Facial flushing and profuse sweating along with potentially dangerous increases in heart rate and blood pressure will be evident. Effect on ADL: cannot use mouse of computer, fine motor skills difficult with right hand Timing: Constant Modifying factors: meds take edge off only briefly  Onset and Duration: Present less than 3 months Cause of pain: Unknown Severity: Getting worse, NAS-11 at its worse: 9/10, NAS-11 at its best: 5/10, NAS-11 now: 8/10 and NAS-11 on the average: 8/10 Timing: Not influenced by the time of the day Aggravating Factors: na Alleviating Factors: na Associated Problems: Numbness, Tingling, Weakness and Pain that wakes patient up Quality of Pain: Aching, Constant, Disabling, Exhausting, Feeling of weight, Nagging and Tingling Previous Examinations or Tests: MRI scan and Neurosurgical evaluation Previous Treatments: Narcotic medications and Steroid treatments by mouth  The patient comes into the clinics today, referred to [place] for a right cervical epidural steroid injection.  Briefly patient describes right neck pain  that radiates into his right shoulder and down  his right arm with numbness present now and all of his fingers.  The numbness initially started out in his middle and ring finger but now has progressed.  Patient is also seeing weakness and trouble holding utensils.  This is been present for greater than 2 months.  Patient is tried prednisone, NSAIDs including ibuprofen, naproxen, diclofenac membrane stabilizers including gabapentin as well as hydrocodone.  Patient has not tried any epidural steroid injections.  Patient does have symptoms of right cervical radiculopathy, myelopathy.  Meds   Current Outpatient Medications:  .  amlodipine-atorvastatin (CADUET) 10-10 MG tablet, Take 1 tablet by mouth daily., Disp: , Rfl:  .  aspirin EC 81 MG tablet, Take 81 mg by mouth daily., Disp: , Rfl:  .  atorvastatin (LIPITOR) 10 MG tablet, Take 10 mg by mouth daily., Disp: , Rfl:  .  gabapentin (NEURONTIN) 100 MG capsule, Take 100 mg by mouth 4 (four) times daily., Disp: , Rfl:  .  pantoprazole (PROTONIX) 40 MG tablet, Take 40 mg by mouth 2 (two) times daily., Disp: , Rfl:  .  PARoxetine (PAXIL) 20 MG tablet, Take 20 mg by mouth daily., Disp: , Rfl:  .  albuterol (PROVENTIL) (2.5 MG/3ML) 0.083% nebulizer solution, Take 2.5 MG/ML Hunter 2-4 hours prn sob/wheezing (Patient not taking: Reported on 12/24/2017), Disp: 75 mL, Rfl: 12  Current Facility-Administered Medications:  .  dexamethasone (DECADRON) injection 10 mg, 10 mg, Other, Once, Devun Anna, MD .  dexamethasone (DECADRON) injection 10 mg, 10 mg, Other, Once, Anaise Sterbenz, MD .  iopamidol (ISOVUE-M) 41 % intrathecal injection 10 mL, 10 mL, Epidural, Once, Kiffany Schelling, MD .  lactated ringers infusion 1,000 mL, 1,000 mL, Intravenous, Once, Tariana Moldovan, MD .  ropivacaine (PF) 2 mg/mL (0.2%) (NAROPIN) injection 1 mL, 1 mL, Epidural, Once, Barbara Keng, MD .  sodium chloride flush (NS) 0.9 % injection 1 mL, 1 mL, Other, Once, Gillis Santa, MD  Imaging Review  Cervical Imaging: Cervical MR wo  contrast:  Results for orders placed during the hospital encounter of 12/12/17  MR CERVICAL SPINE WO CONTRAST   Narrative CLINICAL DATA:  57 year old male with 1 month of right arm pain and numbness. Numbness in the right 3rd through 5th fingers. Painful range of motion. 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  EXAM: MRI CERVICAL SPINE WITHOUT CONTRAST  TECHNIQUE: Multiplanar, multisequence MR imaging of the cervical spine was performed. No intravenous contrast was administered.  COMPARISON:  Brain MRI 02/21/2017.  Neck CT 11/04/2014.  FINDINGS: Alignment: Mild straightening of cervical lordosis, not significantly changed since 2015. Mild chronic retrolisthesis of C6 on C7 is stable.  Vertebrae: Visualized bone marrow signal is within normal limits. No marrow edema or evidence of acute osseous abnormality.  Cord: Spinal cord signal is within normal limits at all visualized levels.  Posterior Fossa, vertebral arteries, paraspinal tissues: Cervicomedullary junction is within normal limits.  The left vertebral artery appears occluded throughout the neck and at the skull base (series 3, image 10), unchanged from the prior MRI. Other major vascular flow voids in the neck are preserved (the left IJ is surgically absent). The neck soft tissue contours appear stable since 2015.  Disc levels:  C2-C3: Small central disc protrusion with annular fissure (series 7, image 3). No stenosis.  C3-C4: Minimal disc bulge and endplate spurring. Mild to moderate facet hypertrophy greater on the right. No spinal stenosis. Mild to moderate right  and mild left C4 neural foraminal stenosis.  C4-C5: Minimal endplate spurring and facet hypertrophy. Borderline to mild right C5 foraminal stenosis.  C5-C6:  Minimal endplate spurring.  No stenosis.  C6-C7: Chronic mild retrolisthesis and disc space loss. Circumferential disc bulge and  endplate spurring with right lateral recess to right foraminal small disc protrusion as seen on series 3, image 4 and series 6, image 22. Superimposed endplate spurring. No spinal stenosis, but moderate to severe right C7 neural foraminal stenosis. There is contralateral moderate to severe left C7 foraminal stenosis also, but more related to chronic disc osteophyte complex.  C7-T1:  Mild to moderate facet hypertrophy.  No stenosis.  No upper thoracic spinal stenosis.  IMPRESSION: 1. The symptomatic level is favored to be C6-C7 where a rightward disc herniation is superimposed on chronic disc and endplate degeneration and affects the right neural foramen. Query right C7 radiculitis. No associated spinal stenosis. 2. Mild for age cervical spine degeneration elsewhere. Mild to moderate C4 neural foraminal stenosis, greater on the right. 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   Electronically Signed   By: Genevie Ann M.D.   On: 12/12/2017 11:12       Complexity Note: Imaging results reviewed. Results shared with Mr. Spilde, using Layman's terms.                         ROS  Cardiovascular History: Daily Aspirin intake and High blood pressure Pulmonary or Respiratory History: Coughing up mucus (Bronchitis) Neurological History: No reported neurological signs or symptoms such as seizures, abnormal skin sensations, urinary and/or fecal incontinence, being born with an abnormal open spine and/or a tethered spinal cord Review of Past Neurological Studies:  Results for orders placed or performed during the hospital encounter of 02/21/17  MR BRAIN W WO CONTRAST   Narrative   CLINICAL DATA:  RIGHT hand and chin tremors. Headaches and dizziness. Balance issues.  EXAM: MRI HEAD WITHOUT AND WITH CONTRAST  TECHNIQUE: Multiplanar, multiecho pulse sequences of the brain and surrounding structures were obtained without and  with intravenous contrast.  CONTRAST:  20m MULTIHANCE GADOBENATE DIMEGLUMINE 529 MG/ML IV SOLN  COMPARISON:  CT head 08/22/2016.  FINDINGS: Brain: No acute infarction, hemorrhage, hydrocephalus, extra-axial collection or mass lesion. Normal for age cerebral volume. Minor subcortical and periventricular white matter signal abnormality, nonspecific, likely chronic microvascular ischemic change.  Post infusion, no abnormal enhancement of the brain or meninges.  Vascular: Flow voids are maintained throughout the carotid, basilar, and vertebral arteries. There are no areas of chronic hemorrhage.  Skull and upper cervical spine: Fatty replaced marrow, status post XRT for oropharyngeal cancer.  Sinuses/Orbits: Negative.  Other: None.  IMPRESSION: Minor white matter disease, nonspecific.  No acute intracranial findings. No cause is seen for the reported symptoms.   Electronically Signed   By: JStaci RighterM.D.   On: 02/21/2017 11:00   Results for orders placed or performed during the hospital encounter of 08/22/16  CT HEAD WO CONTRAST   Narrative   CLINICAL DATA:  Headache for 2 days  EXAM: CT HEAD WITHOUT CONTRAST  TECHNIQUE: Contiguous axial images were obtained from the base of the skull through the vertex without intravenous contrast.  COMPARISON:  04/04/2009  FINDINGS: Brain: No intracranial hemorrhage, mass effect or midline shift. No acute cortical infarction. Ventricular size is stable from prior exam. No intra or extra-axial fluid collection. No mass lesion is noted on  this unenhanced scan.  Vascular: No hyperdense vessel or unexpected calcification.  Skull: Normal. Negative for fracture or focal lesion.  Sinuses/Orbits: No acute finding.  Other: None.  IMPRESSION: No acute intracranial abnormality.  No significant change.   Electronically Signed   By: Lahoma Crocker M.D.   On: 08/22/2016 11:41    Psychological-Psychiatric History:  Depressed Gastrointestinal History: No reported gastrointestinal signs or symptoms such as vomiting or evacuating blood, reflux, heartburn, alternating episodes of diarrhea and constipation, inflamed or scarred liver, or pancreas or irrregular and/or infrequent bowel movements Genitourinary History: No reported renal or genitourinary signs or symptoms such as difficulty voiding or producing urine, peeing blood, non-functioning kidney, kidney stones, difficulty emptying the bladder, difficulty controlling the flow of urine, or chronic kidney disease Hematological History: No reported hematological signs or symptoms such as prolonged bleeding, low or poor functioning platelets, bruising or bleeding easily, hereditary bleeding problems, low energy levels due to low hemoglobin or being anemic Endocrine History: No reported endocrine signs or symptoms such as high or low blood sugar, rapid heart rate due to high thyroid levels, obesity or weight gain due to slow thyroid or thyroid disease Rheumatologic History: No reported rheumatological signs and symptoms such as fatigue, joint pain, tenderness, swelling, redness, heat, stiffness, decreased range of motion, with or without associated rash Musculoskeletal History: Negative for myasthenia gravis, muscular dystrophy, multiple sclerosis or malignant hyperthermia Work History: Working full time  Allergies  Mr. Lodes has No Known Allergies.  Laboratory Chemistry  Inflammation Markers (CRP: Acute Phase) (ESR: Chronic Phase) No results found for: CRP, ESRSEDRATE, LATICACIDVEN               Rheumatology Markers No results found for: RF, ANA, LABURIC, URICUR, LYMEIGGIGMAB, LYMEABIGMQN              Renal Function Markers Lab Results  Component Value Date   CREATININE 1.10 02/21/2017                 Hepatic Function Markers No results found for: AST, ALT, ALBUMIN, ALKPHOS, HCVAB, AMYLASE, LIPASE, AMMONIA               Electrolytes No results found  for: NA, K, CL, CALCIUM, MG, PHOS               Neuropathy Markers No results found for: VITAMINB12, FOLATE, HGBA1C, HIV               Bone Pathology Markers No results found for: VD25OH, [id number], [id number], [id number], 25OHVITD1, 25OHVITD2, 25OHVITD3, TESTOFREE, TESTOSTERONE               Coagulation Parameters No results found for: INR, LABPROT, APTT, PLT, DDIMER               Cardiovascular Markers No results found for: BNP, CKTOTAL, CKMB, TROPONINI, HGB, HCT               CA Markers No results found for: CEA, CA125, LABCA2               Note: Lab results reviewed.  PFSH  Drug: Mr. Horst  reports that he does not use drugs. Alcohol:  reports that he does not drink alcohol. Tobacco:  reports that  has never smoked. he has never used smokeless tobacco. 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 Family: family history is not on file.  Past Surgical History:  Procedure Laterality Date  .  ADENOIDECTOMY    . APPENDECTOMY    . RADICAL NECK DISSECTION  2002 and 2014   left cervical neck  . TONSILLECTOMY     Active Ambulatory Problems    Diagnosis Date Noted  . Dyspnea and respiratory abnormality 10/28/2014   Resolved Ambulatory Problems    Diagnosis Date Noted  . No Resolved Ambulatory Problems   Past Medical History:  Diagnosis Date  . Malignant tumor of neck ([place])   . 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 In no apparent acute distress Vitals:   12/24/17 1039  BP: (!) 155/99  Pulse: 77  Resp: 16  Temp: 98.3 F (36.8 C)  TempSrc: Oral  SpO2: 100%  Weight: 205 lb (93 kg)  Height: '6\' 2"'$  (1.88 m)   BMI Assessment: Estimated body mass index is 26.32 kg/m as calculated from the following:   Height as of this encounter: '6\' 2"'$  (1.88 m).   Weight as of this encounter: 205 lb (93 kg).  BMI interpretation  table: BMI level Category Range association with higher incidence of chronic pain  <18 kg/m2 Underweight   18.5-24.9 kg/m2 Ideal body weight   25-29.9 kg/m2 Overweight Increased incidence by 20%  30-34.9 kg/m2 Obese (Class I) Increased incidence by 68%  35-39.9 kg/m2 Severe obesity (Class II) Increased incidence by 136%  >40 kg/m2 Extreme obesity (Class III) Increased incidence by 254%   BMI Readings from Last 4 Encounters:  12/24/17 26.32 kg/m  11/26/14 27.37 kg/m  10/29/14 27.00 kg/m   Wt Readings from Last 4 Encounters:  12/24/17 205 lb (93 kg)  11/26/14 219 lb (99.3 kg)  10/29/14 216 lb (98 kg)  Psych/Mental status: Alert, oriented x 3 (person, place, & time)       Eyes: PERLA Respiratory: No evidence of acute respiratory distress  Cervical spine: -Skin and axial inspection: Left scar from previous radical neck dissection surgery.  It does not extend past the left sternocleidomastoid. Alignment: Symmetrical -Functional range of motion: Decreased range of motion with cervical extension to the right.  Positive Spurling's on right -Sensory: Dermatomal, neuropathic pain pattern Muscle tone/strength: Functionally intact -Palpation no palpable anomalies  4 out of 5 strength RIGHT upper extremity: Shoulder abduction, elbow flexion, elbow extension, thumb extension. 5 out of 5 strength LEFT upper extremity: Shoulder abduction, elbow flexion, elbow extension, thumb extension.   Lumbar Spine Area Exam  Skin & Axial Inspection: No masses, redness, or swelling Alignment: Symmetrical Functional ROM: Unrestricted ROM      Stability: No instability detected Muscle Tone/Strength: Functionally intact. No obvious neuro-muscular anomalies detected. Sensory (Neurological): Unimpaired Palpation: No palpable anomalies       Provocative Tests: Lumbar Hyperextension and rotation test: evaluation deferred today       Lumbar Lateral bending test: evaluation deferred today       Patrick's  Maneuver: evaluation deferred today                    Gait & Posture Assessment  Ambulation: Unassisted Gait: Relatively normal for age and body habitus Posture: WNL   Lower Extremity Exam    Side: Right lower extremity  Side: Left lower extremity  Skin & Extremity Inspection: Skin color, temperature, and hair growth are WNL. No peripheral edema or cyanosis. No masses, redness, swelling, asymmetry, or associated skin lesions. No contractures.  Skin & Extremity Inspection: Skin color, temperature, and hair growth are WNL. No peripheral edema or cyanosis. No  masses, redness, swelling, asymmetry, or associated skin lesions. No contractures.  Functional ROM: Unrestricted ROM          Functional ROM: Unrestricted ROM          Muscle Tone/Strength: Functionally intact. No obvious neuro-muscular anomalies detected.  Muscle Tone/Strength: Functionally intact. No obvious neuro-muscular anomalies detected.  Sensory (Neurological): Unimpaired  Sensory (Neurological): Unimpaired  Palpation: No palpable anomalies  Palpation: No palpable anomalies    Procedure  57 year old male who presents with neck pain that radiates into his right shoulder and right arm secondary to C6, C7 disc herniation resulting in cervical radiculopathy.  The patient is not on any blood thinners.  Risks and benefits of cervical epidural steroid injection were discussed and patient would like to proceed.  We will plan for right cervical epidural steroid injection.  Please see procedure note.

## 2017-12-24 NOTE — Progress Notes (Deleted)
Patient's Name: Randall Hunter  MRN: [id number]  Referring Provider: Meade Maw, MD  DOB: 1961/07/24  PCP: Rusty Aus, MD  DOS: 12/24/2017  Note by: Gillis Santa, MD  Service setting: Ambulatory outpatient  Specialty: Interventional Pain Management  Location: [facility] (AMB) Pain Management Facility  Visit type: Initial Patient Evaluation  Patient type: New Patient   Primary Reason(s) for Visit: Encounter for initial evaluation of one or more chronic problems (new to examiner) potentially causing chronic pain, and posing a threat to normal musculoskeletal function. (Level of risk: High) CC: No chief complaint on file.  HPI  Randall Hunter is a 57 y.o. year old, male patient, who comes today to see [place] for the first time for an initial evaluation of his chronic pain. He has Dyspnea and respiratory abnormality on their problem list. Today he comes in for evaluation of his No chief complaint on file.  Pain Assessment: Location:     Radiating:   Onset:   Duration:   Quality:   Severity:  /10 (self-reported pain score)  Note: Reported level is compatible with observation.                         When using our objective Pain Scale, levels between 6 and 10/10 are said to belong in an emergency room, as it progressively worsens from a 6/10, described as severely limiting, requiring emergency care not usually available at an outpatient pain management facility. At a 6/10 level, communication becomes difficult and requires great effort. Assistance to reach the emergency department may be required. Facial flushing and profuse sweating along with potentially dangerous increases in heart rate and blood pressure will be evident. Effect on ADL:   Timing:   Modifying factors:    Onset and Duration: {Hx; Onset and Duration:210120511} Cause of pain: {Hx; Cause:210120521} Severity: {Pain Severity:210120502} Timing: {Symptoms; Timing:210120501} Aggravating Factors: {Causes; Aggravating pain  factors:210120507} Alleviating Factors: {Causes; Alleviating Factors:210120500} Associated Problems: {Hx; Associated problems:210120515} Quality of Pain: {Hx; Symptom quality or Descriptor:210120531} Previous Examinations or Tests: {Hx; Previous examinations or test:210120529} Previous Treatments: {Hx; Previous Treatment:210120503}  The patient comes into the clinics today for the first time for a chronic pain management evaluation. ***  Today I took the time to provide the patient with information regarding my pain practice. The patient was informed that my practice is divided into two sections: an interventional pain management section, as well as a completely separate and distinct medication management section. I explained that I have procedure days for my interventional therapies, and evaluation days for follow-ups and medication management. Because of the amount of documentation required during both, they are kept separated. This means that there is the possibility that he may be scheduled for a procedure on one day, and medication management the next. I have also informed him that because of staffing and facility limitations, I no longer take patients for medication management only. To illustrate the reasons for this, I gave the patient the example of surgeons, and how inappropriate it would be to refer a patient to his/her care, just to write for the post-surgical antibiotics on a surgery done by a different surgeon.   Because interventional pain management is my board-certified specialty, the patient was informed that joining my practice means that they are open to any and all interventional therapies. I made it clear that this does not mean that they will be forced to have any procedures done. What this means is that I believe  interventional therapies to be essential part of the diagnosis and proper management of chronic pain conditions. Therefore, patients not interested in these interventional  alternatives will be better served under the care of a different practitioner.  The patient was also made aware of my Comprehensive Pain Management Safety Guidelines where by joining my practice, they limit all of their nerve blocks and joint injections to those done by our practice, for as long as we are retained to manage their care.   Historic Controlled Substance Pharmacotherapy Review  PMP and historical list of controlled substances: ***  Highest opioid analgesic regimen found: ***  Most recent opioid analgesic: ***  Current opioid analgesics: ***  Highest recorded MME/day: *** mg/day MME/day: *** mg/day Medications: The patient did not bring the medication(s) to the appointment, as requested in our "New Patient Package" Pharmacodynamics: Desired effects: Analgesia: The patient reports >50% benefit. Reported improvement in function: The patient reports medication allows him to accomplish basic ADLs. Clinically meaningful improvement in function (CMIF): Sustained CMIF goals met Perceived effectiveness: Described as relatively effective, allowing for increase in activities of daily living (ADL) Undesirable effects: Side-effects or Adverse reactions: None reported Historical Monitoring: The patient  reports that he does not use drugs. List of all UDS Test(s): No results found for: MDMA, COCAINSCRNUR, [place], [place], CANNABQUANT, [place], [place] List of other Serum/Urine Drug Screening Test(s):  No results found for: AMPHSCRSER, BARBSCRSER, BENZOSCRSER, COCAINSCRSER, COCAINSCRNUR, PCPSCRSER, PCPQUANT, THCSCRSER, THCU, CANNABQUANT, OPIATESCRSER, OXYSCRSER, PROPOXSCRSER, ETH Historical Background Evaluation: [place] PMP: Six (6) year initial data search conducted.             PMP NARX Score Report:  Narcotic: *** Sedative: *** Stimulant: *** [place] Department of public safety, offender search: Editor, commissioning Information) Non-contributory Risk Assessment Profile: Aberrant behavior: None observed or  detected today Risk factors for fatal opioid overdose: None identified today PMP NARX Overdose Risk Score: *** Fatal overdose hazard ratio (HR): Calculation deferred Non-fatal overdose hazard ratio (HR): Calculation deferred Risk of opioid abuse or dependence: 0.7-3.0% with doses ? 36 MME/day and 6.1-26% with doses ? 120 MME/day. Substance use disorder (SUD) risk level: Pending results of Medical Psychology Evaluation for SUD Opioid risk tool (ORT) (Total Score):    ORT Scoring interpretation table:  Score <3 = Low Risk for SUD  Score between 4-7 = Moderate Risk for SUD  Score >8 = High Risk for Opioid Abuse   PHQ-2 Depression Scale:  Total score:    PHQ-2 Scoring interpretation table: (Score and probability of major depressive disorder)  Score 0 = No depression  Score 1 = 15.4% Probability  Score 2 = 21.1% Probability  Score 3 = 38.4% Probability  Score 4 = 45.5% Probability  Score 5 = 56.4% Probability  Score 6 = 78.6% Probability   PHQ-9 Depression Scale:  Total score:    PHQ-9 Scoring interpretation table:  Score 0-4 = No depression  Score 5-9 = Mild depression  Score 10-14 = Moderate depression  Score 15-19 = Moderately severe depression  Score 20-27 = Severe depression (2.4 times higher risk of SUD and 2.89 times higher risk of overuse)   Pharmacologic Plan: As per protocol, I have not taken over any controlled substance management, pending the results of ordered tests and/or consults.            Initial impression: Pending review of available data and ordered tests.  Meds   Current Outpatient Medications:  .  albuterol (PROVENTIL) (2.5 MG/3ML) 0.083% nebulizer solution, Take 2.5 MG/ML every 2-4  hours prn sob/wheezing, Disp: 75 mL, Rfl: 12 .  pantoprazole (PROTONIX) 40 MG tablet, Take 40 mg by mouth 2 (two) times daily., Disp: , Rfl:  .  PARoxetine (PAXIL) 20 MG tablet, Take 20 mg by mouth daily., Disp: , Rfl:   Imaging Review  Cervical Imaging: Cervical MR wo  contrast:  Results for orders placed during the hospital encounter of 12/12/17  MR CERVICAL SPINE WO CONTRAST   Narrative CLINICAL DATA:  57 year old male with 1 month of right arm pain and numbness. Numbness in the right 3rd through 5th fingers. Painful range of motion. 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  EXAM: MRI CERVICAL SPINE WITHOUT CONTRAST  TECHNIQUE: Multiplanar, multisequence MR imaging of the cervical spine was performed. No intravenous contrast was administered.  COMPARISON:  Brain MRI 02/21/2017.  Neck CT 11/04/2014.  FINDINGS: Alignment: Mild straightening of cervical lordosis, not significantly changed since 2015. Mild chronic retrolisthesis of C6 on C7 is stable.  Vertebrae: Visualized bone marrow signal is within normal limits. No marrow edema or evidence of acute osseous abnormality.  Cord: Spinal cord signal is within normal limits at all visualized levels.  Posterior Fossa, vertebral arteries, paraspinal tissues: Cervicomedullary junction is within normal limits.  The left vertebral artery appears occluded throughout the neck and at the skull base (series 3, image 10), unchanged from the prior MRI. Other major vascular flow voids in the neck are preserved (the left IJ is surgically absent). The neck soft tissue contours appear stable since 2015.  Disc levels:  C2-C3: Small central disc protrusion with annular fissure (series 7, image 3). No stenosis.  C3-C4: Minimal disc bulge and endplate spurring. Mild to moderate facet hypertrophy greater on the right. No spinal stenosis. Mild to moderate right and mild left C4 neural foraminal stenosis.  C4-C5: Minimal endplate spurring and facet hypertrophy. Borderline to mild right C5 foraminal stenosis.  C5-C6:  Minimal endplate spurring.  No stenosis.  C6-C7: Chronic mild retrolisthesis and disc space loss. Circumferential disc bulge and  endplate spurring with right lateral recess to right foraminal small disc protrusion as seen on series 3, image 4 and series 6, image 22. Superimposed endplate spurring. No spinal stenosis, but moderate to severe right C7 neural foraminal stenosis. There is contralateral moderate to severe left C7 foraminal stenosis also, but more related to chronic disc osteophyte complex.  C7-T1:  Mild to moderate facet hypertrophy.  No stenosis.  No upper thoracic spinal stenosis.  IMPRESSION: 1. The symptomatic level is favored to be C6-C7 where a rightward disc herniation is superimposed on chronic disc and endplate degeneration and affects the right neural foramen. Query right C7 radiculitis. No associated spinal stenosis. 2. Mild for age cervical spine degeneration elsewhere. Mild to moderate C4 neural foraminal stenosis, greater on the right. 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   Electronically Signed   By: Genevie Ann M.D.   On: 12/12/2017 11:12    Cervical MR wo contrast: No results found for this or any previous visit. Cervical MR w/wo contrast: No results found for this or any previous visit. Cervical MR w contrast: No results found for this or any previous visit. Cervical CT wo contrast: No results found for this or any previous visit. Cervical CT w/wo contrast: No results found for this or any previous visit. Cervical CT w/wo contrast: No results found for this or any previous visit. Cervical CT w contrast: No  results found for this or any previous visit. Cervical CT outside: No results found for this or any previous visit. Cervical DG 1 view: No results found for this or any previous visit. Cervical DG 2-3 views: No results found for this or any previous visit. Cervical DG F/E views: No results found for this or any previous visit. Cervical DG 2-3 clearing views: No results found for this or any previous  visit. Cervical DG Bending/F/E views: No results found for this or any previous visit. Cervical DG complete: No results found for this or any previous visit. Cervical DG Myelogram views: No results found for this or any previous visit. Cervical DG Myelogram views: No results found for this or any previous visit. Cervical Discogram views: No results found for this or any previous visit.  Shoulder Imaging: Shoulder-R MR w contrast: No results found for this or any previous visit. Shoulder-L MR w contrast: No results found for this or any previous visit. Shoulder-R MR w/wo contrast: No results found for this or any previous visit. Shoulder-L MR w/wo contrast: No results found for this or any previous visit. Shoulder-R MR wo contrast: No results found for this or any previous visit. Shoulder-L MR wo contrast: No results found for this or any previous visit. Shoulder-R CT w contrast: No results found for this or any previous visit. Shoulder-L CT w contrast: No results found for this or any previous visit. Shoulder-R CT w/wo contrast: No results found for this or any previous visit. Shoulder-L CT w/wo contrast: No results found for this or any previous visit. Shoulder-R CT wo contrast: No results found for this or any previous visit. Shoulder-L CT wo contrast: No results found for this or any previous visit. Shoulder-R DG Arthrogram: No results found for this or any previous visit. Shoulder-L DG Arthrogram: No results found for this or any previous visit. Shoulder-R DG 1 view: No results found for this or any previous visit. Shoulder-L DG 1 view: No results found for this or any previous visit. Shoulder-R DG: No results found for this or any previous visit. Shoulder-L DG: No results found for this or any previous visit.  Thoracic Imaging: Thoracic MR wo contrast: No results found for this or any previous visit. Thoracic MR wo contrast: No results found for this or any previous visit. Thoracic  MR w/wo contrast: No results found for this or any previous visit. Thoracic MR w contrast: No results found for this or any previous visit. Thoracic CT wo contrast: No results found for this or any previous visit. Thoracic CT w/wo contrast: No results found for this or any previous visit. Thoracic CT w/wo contrast: No results found for this or any previous visit. Thoracic CT w contrast: No results found for this or any previous visit. Thoracic DG 2-3 views: No results found for this or any previous visit. Thoracic DG 4 views: No results found for this or any previous visit. Thoracic DG: No results found for this or any previous visit. Thoracic DG w/swimmers view: No results found for this or any previous visit. Thoracic DG Myelogram views: No results found for this or any previous visit. Thoracic DG Myelogram views: No results found for this or any previous visit.  Lumbosacral Imaging: Lumbar MR wo contrast: No results found for this or any previous visit. Lumbar MR wo contrast: No results found for this or any previous visit. Lumbar MR w/wo contrast: No results found for this or any previous visit. Lumbar MR w/wo contrast: No results  found for this or any previous visit. Lumbar MR w contrast: No results found for this or any previous visit. Lumbar CT wo contrast: No results found for this or any previous visit. Lumbar CT w/wo contrast: No results found for this or any previous visit. Lumbar CT w/wo contrast: No results found for this or any previous visit. Lumbar CT w contrast: No results found for this or any previous visit. Lumbar DG 1V: No results found for this or any previous visit. Lumbar DG 1V (Clearing): No results found for this or any previous visit. Lumbar DG 2-3V (Clearing): No results found for this or any previous visit. Lumbar DG 2-3 views: No results found for this or any previous visit. Lumbar DG (Complete) 4+V: No results found for this or any previous visit. Lumbar DG  F/E views: No results found for this or any previous visit. Lumbar DG Bending views: No results found for this or any previous visit. Lumbar DG Myelogram views: No results found for this or any previous visit. Lumbar DG Myelogram: No results found for this or any previous visit. Lumbar DG Myelogram: No results found for this or any previous visit. Lumbar DG Myelogram: No results found for this or any previous visit. Lumbar DG Myelogram Lumbosacral: No results found for this or any previous visit. Lumbar DG Diskogram views: No results found for this or any previous visit. Lumbar DG Diskogram views: No results found for this or any previous visit. Lumbar DG Epidurogram OP: No results found for this or any previous visit. Lumbar DG Epidurogram IP: No results found for this or any previous visit.  Sacroiliac Joint Imaging: Sacroiliac Joint DG: No results found for this or any previous visit. Sacroiliac Joint MR w/wo contrast: No results found for this or any previous visit. Sacroiliac Joint MR wo contrast: No results found for this or any previous visit.  Spine Imaging: Whole Spine DG Myelogram views: No results found for this or any previous visit. Whole Spine MR Mets screen: No results found for this or any previous visit. Whole Spine MR Mets screen: No results found for this or any previous visit. Whole Spine MR w/wo: No results found for this or any previous visit. MRA Spinal Canal w/ cm: No results found for this or any previous visit. MRA Spinal Canal wo/ cm: No results found for this or any previous visit. MRA Spinal Canal w/wo cm: No results found for this or any previous visit. Spine Outside MR Films: No results found for this or any previous visit. Spine Outside CT Films: No results found for this or any previous visit. CT-Guided Biopsy: No results found for this or any previous visit. CT-Guided Needle Placement: No results found for this or any previous visit. DG Spine outside: No  results found for this or any previous visit. IR Spine outside: No results found for this or any previous visit. NM Spine outside: No results found for this or any previous visit.  Hip Imaging: Hip-R MR w contrast: No results found for this or any previous visit. Hip-L MR w contrast: No results found for this or any previous visit. Hip-R MR w/wo contrast: No results found for this or any previous visit. Hip-L MR w/wo contrast: No results found for this or any previous visit. Hip-R MR wo contrast: No results found for this or any previous visit. Hip-L MR wo contrast: No results found for this or any previous visit. Hip-R CT w contrast: No results found for this or any previous  visit. Hip-L CT w contrast: No results found for this or any previous visit. Hip-R CT w/wo contrast: No results found for this or any previous visit. Hip-L CT w/wo contrast: No results found for this or any previous visit. Hip-R CT wo contrast: No results found for this or any previous visit. Hip-L CT wo contrast: No results found for this or any previous visit. Hip-R DG 2-3 views: No results found for this or any previous visit. Hip-L DG 2-3 views: No results found for this or any previous visit. Hip-R DG Arthrogram: No results found for this or any previous visit. Hip-L DG Arthrogram: No results found for this or any previous visit. Hip-B DG Bilateral: No results found for this or any previous visit.  Knee Imaging: Knee-R MR w contrast: No results found for this or any previous visit. Knee-L MR w contrast: No results found for this or any previous visit. Knee-R MR w/wo contrast: No results found for this or any previous visit. Knee-L MR w/wo contrast: No results found for this or any previous visit. Knee-R MR wo contrast: No results found for this or any previous visit. Knee-L MR wo contrast: No results found for this or any previous visit. Knee-R CT w contrast: No results found for this or any previous  visit. Knee-L CT w contrast: No results found for this or any previous visit. Knee-R CT w/wo contrast: No results found for this or any previous visit. Knee-L CT w/wo contrast: No results found for this or any previous visit. Knee-R CT wo contrast: No results found for this or any previous visit. Knee-L CT wo contrast: No results found for this or any previous visit. Knee-R DG 1-2 views: No results found for this or any previous visit. Knee-L DG 1-2 views: No results found for this or any previous visit. Knee-R DG 3 views: No results found for this or any previous visit. Knee-L DG 3 views: No results found for this or any previous visit. Knee-R DG 4 views: No results found for this or any previous visit. Knee-L DG 4 views: No results found for this or any previous visit. Knee-R DG Arthrogram: No results found for this or any previous visit. Knee-L DG Arthrogram: No results found for this or any previous visit.  Ankle Imaging: Ankle-R DG Complete: No results found for this or any previous visit. Ankle-L DG Complete: No results found for this or any previous visit.  Foot Imaging: Foot-R DG Complete: No results found for this or any previous visit. Foot-L DG Complete: No results found for this or any previous visit.  Complexity Note: Imaging results reviewed. Results shared with Randall Hunter, using Layman's terms.                         ROS  Cardiovascular History: {Hx; Cardiovascular History:210120525} Pulmonary or Respiratory History: {Hx; Pumonary and/or Respiratory History:210120523} Neurological History: {Hx; Neurological:210120504} Review of Past Neurological Studies:  Results for orders placed or performed during the hospital encounter of 02/21/17  MR BRAIN W WO CONTRAST   Narrative   CLINICAL DATA:  RIGHT hand and chin tremors. Headaches and dizziness. Balance issues.  EXAM: MRI HEAD WITHOUT AND WITH CONTRAST  TECHNIQUE: Multiplanar, multiecho pulse sequences of the  brain and surrounding structures were obtained without and with intravenous contrast.  CONTRAST:  61m MULTIHANCE GADOBENATE DIMEGLUMINE 529 MG/ML IV SOLN  COMPARISON:  CT head 08/22/2016.  FINDINGS: Brain: No acute infarction, hemorrhage, hydrocephalus, extra-axial collection or  mass lesion. Normal for age cerebral volume. Minor subcortical and periventricular white matter signal abnormality, nonspecific, likely chronic microvascular ischemic change.  Post infusion, no abnormal enhancement of the brain or meninges.  Vascular: Flow voids are maintained throughout the carotid, basilar, and vertebral arteries. There are no areas of chronic hemorrhage.  Skull and upper cervical spine: Fatty replaced marrow, status post XRT for oropharyngeal cancer.  Sinuses/Orbits: Negative.  Other: None.  IMPRESSION: Minor white matter disease, nonspecific.  No acute intracranial findings. No cause is seen for the reported symptoms.   Electronically Signed   By: Staci Righter M.D.   On: 02/21/2017 11:00   Results for orders placed or performed during the hospital encounter of 08/22/16  CT HEAD WO CONTRAST   Narrative   CLINICAL DATA:  Headache for 2 days  EXAM: CT HEAD WITHOUT CONTRAST  TECHNIQUE: Contiguous axial images were obtained from the base of the skull through the vertex without intravenous contrast.  COMPARISON:  04/04/2009  FINDINGS: Brain: No intracranial hemorrhage, mass effect or midline shift. No acute cortical infarction. Ventricular size is stable from prior exam. No intra or extra-axial fluid collection. No mass lesion is noted on this unenhanced scan.  Vascular: No hyperdense vessel or unexpected calcification.  Skull: Normal. Negative for fracture or focal lesion.  Sinuses/Orbits: No acute finding.  Other: None.  IMPRESSION: No acute intracranial abnormality.  No significant change.   Electronically Signed   By: Lahoma Crocker M.D.   On: 08/22/2016  11:41    Psychological-Psychiatric History: {Hx; Psychological-Psychiatric History:210120512} Gastrointestinal History: {Hx; Gastrointestinal:210120527} Genitourinary History: {Hx; Genitourinary:210120506} Hematological History: {Hx; Hematological:210120510} Endocrine History: {Hx; Endocrine history:210120509} Rheumatologic History: {Hx; Rheumatological:210120530} Musculoskeletal History: {Hx; Musculoskeletal:210120528} Work History: {Hx; Work history:210120514}  Allergies  Randall Hunter has No Known Allergies.  Laboratory Chemistry  Inflammation Markers (CRP: Acute Phase) (ESR: Chronic Phase) No results found for: CRP, ESRSEDRATE, LATICACIDVEN               Rheumatology Markers No results found for: RF, ANA, LABURIC, URICUR, LYMEIGGIGMAB, LYMEABIGMQN              Renal Function Markers Lab Results  Component Value Date   CREATININE 1.10 02/21/2017                 Hepatic Function Markers No results found for: AST, ALT, ALBUMIN, ALKPHOS, HCVAB, AMYLASE, LIPASE, AMMONIA               Electrolytes No results found for: NA, K, CL, CALCIUM, MG, PHOS               Neuropathy Markers No results found for: VITAMINB12, FOLATE, HGBA1C, HIV               Bone Pathology Markers No results found for: VD25OH, [id number], [id number], [id number], 25OHVITD1, 25OHVITD2, 25OHVITD3, TESTOFREE, TESTOSTERONE               Coagulation Parameters No results found for: INR, LABPROT, APTT, PLT, DDIMER               Cardiovascular Markers No results found for: BNP, CKTOTAL, CKMB, TROPONINI, HGB, HCT               CA Markers No results found for: CEA, CA125, LABCA2               Note: Lab results reviewed.  PFSH  Drug: Randall Hunter  reports that he does not use drugs. Alcohol:  reports that he does not  drink alcohol. Tobacco:  reports that  has never smoked. he has never used smokeless tobacco. Medical:  has a past medical history of Malignant tumor of neck and Oropharyngeal  carcinoma. Family: family history is not on file.  Past Surgical History:  Procedure Laterality Date  . ADENOIDECTOMY    . APPENDECTOMY    . RADICAL NECK DISSECTION  2002 and 2014   left cervical neck  . TONSILLECTOMY     Active Ambulatory Problems    Diagnosis Date Noted  . Dyspnea and respiratory abnormality 10/28/2014   Resolved Ambulatory Problems    Diagnosis Date Noted  . No Resolved Ambulatory Problems   Past Medical History:  Diagnosis Date  . Malignant tumor of neck   . Oropharyngeal carcinoma    Constitutional Exam  General appearance: Well nourished, well developed, and well hydrated. In no apparent acute distress There were no vitals filed for this visit. BMI Assessment: Estimated body mass index is 27.37 kg/m as calculated from the following:   Height as of 11/26/14: '6\' 3"'$  (1.905 m).   Weight as of 11/26/14: 219 lb (99.3 kg).  BMI interpretation table: BMI level Category Range association with higher incidence of chronic pain  <18 kg/m2 Underweight   18.5-24.9 kg/m2 Ideal body weight   25-29.9 kg/m2 Overweight Increased incidence by 20%  30-34.9 kg/m2 Obese (Class I) Increased incidence by 68%  35-39.9 kg/m2 Severe obesity (Class II) Increased incidence by 136%  >40 kg/m2 Extreme obesity (Class III) Increased incidence by 254%   BMI Readings from Last 4 Encounters:  11/26/14 27.37 kg/m  10/29/14 27.00 kg/m   Wt Readings from Last 4 Encounters:  11/26/14 219 lb (99.3 kg)  10/29/14 216 lb (98 kg)  Psych/Mental status: Alert, oriented x 3 (person, place, & time)       Eyes: PERLA Respiratory: No evidence of acute respiratory distress  Cervical Spine Area Exam  Skin & Axial Inspection: No masses, redness, edema, swelling, or associated skin lesions Alignment: Symmetrical Functional ROM: Unrestricted ROM      Stability: No instability detected Muscle Tone/Strength: Functionally intact. No obvious neuro-muscular anomalies detected. Sensory  (Neurological): Unimpaired Palpation: No palpable anomalies              Upper Extremity (UE) Exam    Side: Right upper extremity  Side: Left upper extremity  Skin & Extremity Inspection: Skin color, temperature, and hair growth are WNL. No peripheral edema or cyanosis. No masses, redness, swelling, asymmetry, or associated skin lesions. No contractures.  Skin & Extremity Inspection: Skin color, temperature, and hair growth are WNL. No peripheral edema or cyanosis. No masses, redness, swelling, asymmetry, or associated skin lesions. No contractures.  Functional ROM: Unrestricted ROM          Functional ROM: Unrestricted ROM          Muscle Tone/Strength: Functionally intact. No obvious neuro-muscular anomalies detected.  Muscle Tone/Strength: Functionally intact. No obvious neuro-muscular anomalies detected.  Sensory (Neurological): Unimpaired          Sensory (Neurological): Unimpaired          Palpation: No palpable anomalies              Palpation: No palpable anomalies              Specialized Test(s): Deferred         Specialized Test(s): Deferred          Thoracic Spine Area Exam  Skin & Axial Inspection: No masses, redness, or  swelling Alignment: Symmetrical Functional ROM: Unrestricted ROM Stability: No instability detected Muscle Tone/Strength: Functionally intact. No obvious neuro-muscular anomalies detected. Sensory (Neurological): Unimpaired Muscle strength & Tone: No palpable anomalies  Lumbar Spine Area Exam  Skin & Axial Inspection: No masses, redness, or swelling Alignment: Symmetrical Functional ROM: Unrestricted ROM      Stability: No instability detected Muscle Tone/Strength: Functionally intact. No obvious neuro-muscular anomalies detected. Sensory (Neurological): Unimpaired Palpation: No palpable anomalies       Provocative Tests: Lumbar Hyperextension and rotation test: evaluation deferred today       Lumbar Lateral bending test: evaluation deferred today        Patrick's Maneuver: evaluation deferred today                    Gait & Posture Assessment  Ambulation: Unassisted Gait: Relatively normal for age and body habitus Posture: WNL   Lower Extremity Exam    Side: Right lower extremity  Side: Left lower extremity  Skin & Extremity Inspection: Skin color, temperature, and hair growth are WNL. No peripheral edema or cyanosis. No masses, redness, swelling, asymmetry, or associated skin lesions. No contractures.  Skin & Extremity Inspection: Skin color, temperature, and hair growth are WNL. No peripheral edema or cyanosis. No masses, redness, swelling, asymmetry, or associated skin lesions. No contractures.  Functional ROM: Unrestricted ROM          Functional ROM: Unrestricted ROM          Muscle Tone/Strength: Functionally intact. No obvious neuro-muscular anomalies detected.  Muscle Tone/Strength: Functionally intact. No obvious neuro-muscular anomalies detected.  Sensory (Neurological): Unimpaired  Sensory (Neurological): Unimpaired  Palpation: No palpable anomalies  Palpation: No palpable anomalies   Assessment  Primary Diagnosis & Pertinent Problem List: There were no encounter diagnoses.  Visit Diagnosis (New problems to examiner): No diagnosis found. Plan of Care (Initial workup plan)  Note: Please be advised that as per protocol, today's visit has been an evaluation only. We have not taken over the patient's controlled substance management.  Problem-specific plan: No problem-specific Assessment & Plan notes found for this encounter.  Ordered Lab-work, Procedure(s), Referral(s), & Consult(s): No orders of the defined types were placed in this encounter.  Pharmacotherapy (current): Medications ordered:  No orders of the defined types were placed in this encounter.  Medications administered during this visit: Randall Hunter had no medications administered during this visit.   Pharmacological management options:  Opioid  Analgesics: The patient was informed that there is no guarantee that he would be a candidate for opioid analgesics. The decision will be made following CDC guidelines. This decision will be based on the results of diagnostic studies, as well as Randall Hunter risk profile.   Membrane stabilizer: To be determined at a later time  Muscle relaxant: To be determined at a later time  NSAID: To be determined at a later time  Other analgesic(s): To be determined at a later time   Interventional management options: Mr. Meroney was informed that there is no guarantee that he would be a candidate for interventional therapies. The decision will be based on the results of diagnostic studies, as well as Mr. Kulpa risk profile.  Procedure(s) under consideration:  ***   Provider-requested follow-up: No Follow-up on file.  No future appointments.  Primary Care Physician: Rusty Aus, MD Location: [facility] Note by: Gillis Santa, M.D, Date: 12/24/2017; Time: 10:32 AM  There are no Patient Instructions on file for this visit.

## 2017-12-24 NOTE — Patient Instructions (Signed)
____________________________________________________________________________________________  Post-Procedure instructions Instructions:  Apply ice: Fill a plastic sandwich bag with crushed ice. Cover it with a small towel and apply to injection site. Apply for 15 minutes then remove x 15 minutes. Repeat sequence on day of procedure, until you go to bed. The purpose is to minimize swelling and discomfort after procedure.  Apply heat: Apply heat to procedure site starting the day following the procedure. The purpose is to treat any soreness and discomfort from the procedure.  Food intake: Start with clear liquids (like water) and advance to regular food, as tolerated.   Physical activities: Keep activities to a minimum for the first 8 hours after the procedure.   Driving: If you have received any sedation, you are not allowed to drive for 24 hours after your procedure.  Blood thinner: Restart your blood thinner 6 hours after your procedure. (Only for those taking blood thinners)  Insulin: As soon as you can eat, you may resume your normal dosing schedule. (Only for those taking insulin)  Infection prevention: Keep procedure site clean and dry.  Post-procedure Pain Diary: Extremely important that this be done correctly and accurately. Recorded information will be used to determine the next step in treatment.  Pain evaluated is that of treated area only. Do not include pain from an untreated area.  Complete every hour, on the hour, for the initial 8 hours. Set an alarm to help you do this part accurately.  Do not go to sleep and have it completed later. It will not be accurate.  Follow-up appointment: Keep your follow-up appointment after the procedure. Usually 2 weeks for most procedures. (6 weeks in the case of radiofrequency.) Bring you pain diary.  Expect:  From numbing medicine (AKA: Local Anesthetics): Numbness or decrease in pain.  Onset: Full effect within 15 minutes of  injected.  Duration: It will depend on the type of local anesthetic used. On the average, 1 to 8 hours.   From steroids: Decrease in swelling or inflammation. Once inflammation is improved, relief of the pain will follow.  Onset of benefits: Depends on the amount of swelling present. The more swelling, the longer it will take for the benefits to be seen. In some cases, up to 10 days.  Duration: Steroids will stay in the system x 2 weeks. Duration of benefits will depend on multiple posibilities including persistent irritating factors.  From procedure: Some discomfort is to be expected once the numbing medicine wears off. This should be minimal if ice and heat are applied as instructed. Call if:  You experience numbness and weakness that gets worse with time, as opposed to wearing off.  New onset bowel or bladder incontinence. (Spinal procedures only)  Emergency Numbers:  [place] hours (Monday - Thursday, 8:00 AM - 4:00 PM) (Friday, 9:00 AM - 12:00 Noon): [phone]  After hours: [phone] ____________________________________________________________________________________________   Epidural Steroid Injection Patient Information  Description: The epidural space surrounds the nerves as they exit the spinal cord.  In some patients, the nerves can be compressed and inflamed by a bulging disc or a tight spinal canal (spinal stenosis).  By injecting steroids into the epidural space, we can bring irritated nerves into direct contact with a potentially helpful medication.  These steroids act directly on the irritated nerves and can reduce swelling and inflammation which often leads to decreased pain.  Epidural steroids may be injected anywhere along the spine and from the neck to the low back depending upon the location of  your pain.   After numbing the skin with local anesthetic (like Novocaine), a small needle is passed into the epidural space slowly.  You may experience a  sensation of pressure while this is being done.  The entire block usually last less than 10 minutes.  Conditions which may be treated by epidural steroids:   Low back and leg pain  Neck and arm pain  Spinal stenosis  Post-laminectomy syndrome  Herpes zoster (shingles) pain  Pain from compression fractures  Preparation for the injection:  1. Do not eat any solid food or dairy products within 8 hours of your appointment.  2. You may drink clear liquids up to 3 hours before appointment.  Clear liquids include water, black coffee, juice or soda.  No milk or cream please. 3. You may take your regular medication, including pain medications, with a sip of water before your appointment  Diabetics should hold regular insulin (if taken separately) and take 1/2 normal NPH dos the morning of the procedure.  Carry some sugar containing items with you to your appointment. 4. A driver must accompany you and be prepared to drive you home after your procedure.  5. Bring all your current medications with your. 6. An IV may be inserted and sedation may be given at the discretion of the physician.   7. A blood pressure cuff, EKG and other monitors will often be applied during the procedure.  Some patients may need to have extra oxygen administered for a short period. 8. You will be asked to provide medical information, including your allergies, prior to the procedure.  We must know immediately if you are taking blood thinners (like Coumadin/Warfarin)  Or if you are allergic to IV iodine contrast (dye). We must know if you could possible be pregnant.  Possible side-effects:  Bleeding from needle site  Infection (rare, may require surgery)  Nerve injury (rare)  Numbness & tingling (temporary)  Difficulty urinating (rare, temporary)  Spinal headache ( a headache worse with upright posture)  Light -headedness (temporary)  Pain at injection site (several days)  Decreased blood pressure  (temporary)  Weakness in arm/leg (temporary)  Pressure sensation in back/neck (temporary)  Call if you experience:  Fever/chills associated with headache or increased back/neck pain.  Headache worsened by an upright position.  New onset weakness or numbness of an extremity below the injection site  Hives or difficulty breathing (go to the emergency room)  Inflammation or drainage at the infection site  Severe back/neck pain  Any new symptoms which are concerning to you  Please note:  Although the local anesthetic injected can often make your back or neck feel good for several hours after the injection, the pain will likely return.  It takes 3-7 days for steroids to work in the epidural space.  You may not notice any pain relief for at least that one week.  If effective, we will often do a series of three injections spaced 3-6 weeks apart to maximally decrease your pain.  After the initial series, we generally will wait several months before considering a repeat injection of the same type.  If you have any questions, please call [phone] [facility]

## 2017-12-24 NOTE — Progress Notes (Signed)
Patient's Name: Randall Hunter  MRN: [id number]  Referring Provider: Meade Maw, MD  DOB: 08-Jan-1961  PCP: Rusty Aus, MD  DOS: 12/24/2017  Note by: Gillis Santa, MD  Service setting: Ambulatory outpatient  Specialty: Interventional Pain Management  Patient type: Established  Location: [facility] (AMB) Pain Management Facility  Visit type: Interventional Procedure   Primary Reason for Visit: Interventional Pain Management Treatment. CC: Shoulder Pain (right)  Procedure #1:  Anesthesia, Analgesia, Anxiolysis:  Type: Diagnostic, Inter-Laminar, Epidural Steroid Injection #1  Region: Posterior Cervico-thoracic Region Level: C7-T1 Laterality: Right-Sided Paramedial  Type: Local Anesthesia Local Anesthetic: Lidocaine 1% Route: Infiltration ([place]/IM) IV Access: Declined Sedation: Meaningful verbal contact was maintained at all times during the procedure  Indication(s): Analgesia and Anxiety   Indications: 1. Cervical radiculopathy   2. Herniation of cervical intervertebral disc with radiculopathy   3. Chronic right shoulder pain   4. Cervical radiculopathy due to degenerative joint disease of spine    Pain Score: Pre-procedure: 8 /10 Post-procedure: 2 /10  Pre-op Assessment:  Mr. Appelt is a 57 y.o. (year old), male patient, seen today for interventional treatment. He  has a past surgical history that includes Tonsillectomy; Adenoidectomy; Appendectomy; and Radical neck dissection (2002 and 2014). Mr. Mines has a current medication list which includes the following prescription(s): amlodipine-atorvastatin, aspirin ec, atorvastatin, gabapentin, pantoprazole, paroxetine, and albuterol, and the following Facility-Administered Medications: dexamethasone and lactated ringers. His primarily concern today is the Shoulder Pain (right)  Initial Vital Signs:  Pulse Rate: 77 Temp: 98.3 F (36.8 C) Resp: 16 BP: (!) 155/99 SpO2: 100 %  BMI: Estimated body mass index is 26.32 kg/m as  calculated from the following:   Height as of this encounter: 6\' 2"  (1.88 m).   Weight as of this encounter: 205 lb (93 kg).  Risk Assessment: Allergies: Reviewed. He has No Known Allergies.  Allergy Precautions: None required Coagulopathies: Reviewed. None identified.  Blood-thinner therapy: None at this time Active Infection(s): Reviewed. None identified. Mr. Cosma is afebrile  Site Confirmation: Mr. Kazmierczak was asked to confirm the procedure and laterality before marking the site Procedure checklist: Completed Consent: Before the procedure and under the influence of no sedative(s), amnesic(s), or anxiolytics, the patient was informed of the treatment options, risks and possible complications. To fulfill our ethical and legal obligations, as recommended by the American Medical Association's Code of Ethics, I have informed the patient of my clinical impression; the nature and purpose of the treatment or procedure; the risks, benefits, and possible complications of the intervention; the alternatives, including doing nothing; the risk(s) and benefit(s) of the alternative treatment(s) or procedure(s); and the risk(s) and benefit(s) of doing nothing. The patient was provided information about the general risks and possible complications associated with the procedure. These may include, but are not limited to: failure to achieve desired goals, infection, bleeding, organ or nerve damage, allergic reactions, paralysis, and death. In addition, the patient was informed of those risks and complications associated to Spine-related procedures, such as failure to decrease pain; infection (i.e.: Meningitis, epidural or intraspinal abscess); bleeding (i.e.: epidural hematoma, subarachnoid hemorrhage, or any other type of intraspinal or peri-dural bleeding); organ or nerve damage (i.e.: Any type of peripheral nerve, nerve root, or spinal cord injury) with subsequent damage to sensory, motor, and/or autonomic  systems, resulting in permanent pain, numbness, and/or weakness of one or several areas of the body; allergic reactions; (i.e.: anaphylactic reaction); and/or death. Furthermore, the patient was informed of those risks and complications associated with  the medications. These include, but are not limited to: allergic reactions (i.e.: anaphylactic or anaphylactoid reaction(s)); adrenal axis suppression; blood sugar elevation that in diabetics may result in ketoacidosis or comma; water retention that in patients with history of congestive heart failure may result in shortness of breath, pulmonary edema, and decompensation with resultant heart failure; weight gain; swelling or edema; medication-induced neural toxicity; particulate matter embolism and blood vessel occlusion with resultant organ, and/or nervous system infarction; and/or aseptic necrosis of one or more joints. Finally, the patient was informed that Medicine is not an exact science; therefore, there is also the possibility of unforeseen or unpredictable risks and/or possible complications that may result in a catastrophic outcome. The patient indicated having understood very clearly. We have given the patient no guarantees and we have made no promises. Enough time was given to the patient to ask questions, all of which were answered to the patient's satisfaction. Mr. Astarita has indicated that he wanted to continue with the procedure. Attestation: I, the ordering provider, attest that I have discussed with the patient the benefits, risks, side-effects, alternatives, likelihood of achieving goals, and potential problems during recovery for the procedure that I have provided informed consent. Date: 12/24/2017  Time: N/A  Pre-Procedure Preparation:  Monitoring: As per clinic protocol. Respiration, ETCO2, SpO2, BP, heart rate and rhythm monitor placed and checked for adequate function Safety Precautions: Patient was assessed for positional comfort and  pressure points before starting the procedure. Time-out: I initiated and conducted the "Time-out" before starting the procedure, as per protocol. The patient was asked to participate by confirming the accuracy of the "Time Out" information. Verification of the correct person, site, and procedure were performed and confirmed by me, the nursing staff, and the patient. "Time-out" conducted as per Joint Commission's Universal Protocol (UP.01.01.01). "Time-out" Date & Time: 12/24/2017; 1133 hrs.  Description of Procedure Process:   Position: Prone with head of the table was raised to facilitate breathing. Target Area: For Epidural Steroid injections the target is the interlaminar space, initially targeting the lower border of the superior vertebral body lamina. Approach: Paramedial approach. Area Prepped: Entire PosteriorCervical Region Prepping solution: ChloraPrep (2% chlorhexidine gluconate and 70% isopropyl alcohol) Safety Precautions: Aspiration looking for blood return was conducted prior to all injections. At no point did we inject any substances, as a needle was being advanced. No attempts were made at seeking any paresthesias. Safe injection practices and needle disposal techniques used. Medications properly checked for expiration dates. SDV (single dose vial) medications used. Description of the Procedure: Protocol guidelines were followed. The procedure needle was introduced through the skin, ipsilateral to the reported pain, and advanced to the target area. Bone was contacted and the needle walked caudad, until the lamina was cleared. The epidural space was identified using "loss-of-resistance technique" with 2-3 ml of PF-NaCl (0.9% NSS), in a 5cc LOR glass syringe. Vitals:   12/24/17 1130 12/24/17 1135 12/24/17 1140 12/24/17 1145  BP: (!) 158/89 (!) 160/97 (!) 149/77 112/77  Pulse:      Resp: 15 17 16 15   Temp:      TempSrc:      SpO2: 100% 99% 100% 99%  Weight:      Height:         Start Time: 1133 hrs. End Time: 1144 hrs. Materials:  Needle(s) Type: Epidural needle Gauge: 17G Length: 3.5-in Medication(s): Please see orders for medications and dosing details.  Imaging Guidance (Spinal):  Type of Imaging Technique: Fluoroscopy Guidance (Spinal) Indication(s): Assistance in  needle guidance and placement for procedures requiring needle placement in or near specific anatomical locations not easily accessible without such assistance. Exposure Time: Please see nurses notes. Contrast: Before injecting any contrast, we confirmed that the patient did not have an allergy to iodine, shellfish, or radiological contrast. Once satisfactory needle placement was completed at the desired level, radiological contrast was injected. Contrast injected under live fluoroscopy. No contrast complications. See chart for type and volume of contrast used. Fluoroscopic Guidance: I was personally present during the use of fluoroscopy. "Tunnel Vision Technique" used to obtain the best possible view of the target area. Parallax error corrected before commencing the procedure. "Direction-depth-direction" technique used to introduce the needle under continuous pulsed fluoroscopy. Once target was reached, antero-posterior, oblique, and lateral fluoroscopic projection used confirm needle placement in all planes. Images permanently stored in EMR. Interpretation: I personally interpreted the imaging intraoperatively. Adequate needle placement confirmed in multiple planes. Appropriate spread of contrast into desired area was observed. No evidence of afferent or efferent intravascular uptake. No intrathecal or subarachnoid spread observed. Permanent images saved into the patient's record.  Antibiotic Prophylaxis:   Anti-infectives (From admission, onward)   None     Indication(s): None identified  Post-operative Assessment:  Post-procedure Vital Signs:  Pulse Rate: 77 Temp: 98.3 F (36.8 C) Resp:  15 BP: 112/77 SpO2: 99 %  EBL: None  Complications: No immediate post-treatment complications observed by team, or reported by patient.  Note: The patient tolerated the entire procedure well. A repeat set of vitals were taken after the procedure and the patient was kept under observation following institutional policy, for this type of procedure. Post-procedural neurological assessment was performed, showing return to baseline, prior to discharge. The patient was provided with post-procedure discharge instructions, including a section on how to identify potential problems. Should any problems arise concerning this procedure, the patient was given instructions to immediately contact us, at any time, without hesitation. In any case, we plan to contact the patient by telephone for a follow-up status report regarding this interventional procedure.  Comments:  No additional relevant information. 4 out of 5 strength RIGHT upper extremity: Shoulder abduction, elbow flexion, elbow extension, thumb extension. (@ baseline) 5 out of 5 strength LEFT upper extremity: Shoulder abduction, elbow flexion, elbow extension, thumb extension. (@ baseline)   Plan of Care   Imaging Orders     DG C-Arm 1-60 Min-No Report  Procedure Orders     Cervical Epidural Injection  Medications ordered for procedure: Meds ordered this encounter  Medications  . lactated ringers infusion 1,000 mL  . iopamidol (ISOVUE-M) 41 % intrathecal injection 10 mL  . dexamethasone (DECADRON) injection 10 mg  . ropivacaine (PF) 2 mg/mL (0.2%) (NAROPIN) injection 1 mL  . sodium chloride flush (NS) 0.9 % injection 1 mL  . dexamethasone (DECADRON) injection 10 mg   Medications administered: We administered lactated ringers, iopamidol, ropivacaine (PF) 2 mg/mL (0.2%), sodium chloride flush, and dexamethasone.  See the medical record for exact dosing, route, and time of administration.  New Prescriptions   No medications on file    Disposition: Discharge home  Discharge Date & Time: 12/24/2017; 1149 hrs.   Physician-requested Follow-up: No Follow-up on file.  Future Appointments  Date Time Provider [place]  01/07/2018  9:15 AM Gillis Santa, MD [facility] None   Primary Care Physician: Rusty Aus, MD Location: [facility] Outpatient Pain Management Facility Note by: Gillis Santa, MD Date: 12/24/2017; Time: 12:17 PM  Disclaimer:  Medicine is not an  exact science. The only guarantee in medicine is that nothing is guaranteed. It is important to note that the decision to proceed with this intervention was based on the information collected from the patient. The Data and conclusions were drawn from the patient's questionnaire, the interview, and the physical examination. Because the information was provided in large part by the patient, it cannot be guaranteed that it has not been purposely or unconsciously manipulated. Every effort has been made to obtain as much relevant data as possible for this evaluation. It is important to note that the conclusions that lead to this procedure are derived in large part from the available data. Always take into account that the treatment will also be dependent on availability of resources and existing treatment guidelines, considered by other Pain Management Practitioners as being common knowledge and practice, at the time of the intervention. For Medico-Legal purposes, it is also important to point out that variation in procedural techniques and pharmacological choices are the acceptable norm. The indications, contraindications, technique, and results of the above procedure should only be interpreted and judged by a Board-Certified Interventional Pain Specialist with extensive familiarity and expertise in the same exact procedure and technique.

## 2018-01-07 ENCOUNTER — Other Ambulatory Visit: Payer: Self-pay

## 2018-01-07 ENCOUNTER — Encounter
Admission: RE | Admit: 2018-01-07 | Discharge: 2018-01-07 | Disposition: A | Discharge status: Home / Self Care | Payer: BLUE CROSS/BLUE SHIELD | Source: Ambulatory Visit | Attending: Neurosurgery | Admitting: Neurosurgery

## 2018-01-07 ENCOUNTER — Ambulatory Visit: Payer: BLUE CROSS/BLUE SHIELD | Admitting: Student in an Organized Health Care Education/Training Program

## 2018-01-07 DIAGNOSIS — Z85828 Personal history of other malignant neoplasm of skin: Secondary | ICD-10-CM | POA: Diagnosis not present

## 2018-01-07 DIAGNOSIS — M2578 Osteophyte, vertebrae: Secondary | ICD-10-CM | POA: Diagnosis not present

## 2018-01-07 DIAGNOSIS — M5412 Radiculopathy, cervical region: Secondary | ICD-10-CM | POA: Diagnosis not present

## 2018-01-07 DIAGNOSIS — Z85818 Personal history of malignant neoplasm of other sites of lip, oral cavity, and pharynx: Secondary | ICD-10-CM | POA: Diagnosis not present

## 2018-01-07 DIAGNOSIS — Z923 Personal history of irradiation: Secondary | ICD-10-CM | POA: Diagnosis not present

## 2018-01-07 DIAGNOSIS — K219 Gastro-esophageal reflux disease without esophagitis: Secondary | ICD-10-CM | POA: Diagnosis not present

## 2018-01-07 DIAGNOSIS — I10 Essential (primary) hypertension: Secondary | ICD-10-CM | POA: Diagnosis not present

## 2018-01-07 HISTORY — DX: Gastro-esophageal reflux disease without esophagitis: K21.9

## 2018-01-07 HISTORY — DX: Essential (primary) hypertension: I10

## 2018-01-07 HISTORY — DX: Major depressive disorder, single episode, unspecified: F32.9

## 2018-01-07 HISTORY — DX: Depression, unspecified: F32.A

## 2018-01-07 HISTORY — DX: Pneumonia, unspecified organism: J18.9

## 2018-01-07 LAB — BASIC METABOLIC PANEL
Anion gap: 8 (ref 5–15)
BUN: 12 mg/dL (ref 6–20)
CO2: 27 mmol/L (ref 22–32)
Calcium: 8.6 mg/dL — ABNORMAL LOW (ref 8.9–10.3)
Chloride: 102 mmol/L (ref 101–111)
Creatinine, Ser: 0.95 mg/dL (ref 0.61–1.24)
GFR calc Af Amer: >60 mL/min (ref >60)
GFR calc non Af Amer: >60 mL/min (ref >60)
Glucose, Bld: 82 mg/dL (ref 65–99)
Potassium: 3.5 mmol/L (ref 3.5–5.1)
Sodium: 137 mmol/L (ref 135–145)

## 2018-01-07 LAB — DIFFERENTIAL
Basophils Absolute: 0 10*3/uL (ref 0–0.1)
Basophils Relative: 1 %
Eosinophils Absolute: 0.1 10*3/uL (ref 0–0.7)
Eosinophils Relative: 1 %
Lymphocytes Relative: 22 %
Lymphs Abs: 1.6 10*3/uL (ref 1.0–3.6)
Monocytes Absolute: 0.4 10*3/uL (ref 0.2–1.0)
Monocytes Relative: 6 %
Neutro Abs: 5.3 10*3/uL (ref 1.4–6.5)
Neutrophils Relative %: 70 %

## 2018-01-07 LAB — TYPE AND SCREEN
ABO/RH(D): B POS
Antibody Screen: NEGATIVE

## 2018-01-07 LAB — URINALYSIS, ROUTINE W REFLEX MICROSCOPIC
BILIRUBIN URINE: NEGATIVE
GLUCOSE, UA: NEGATIVE mg/dL
Hgb urine dipstick: NEGATIVE
KETONES UR: NEGATIVE mg/dL
Leukocytes, UA: NEGATIVE
Nitrite: NEGATIVE
PH: 6 (ref 5.0–8.0)
Protein, ur: NEGATIVE mg/dL
Specific Gravity, Urine: 1.005 (ref 1.005–1.030)

## 2018-01-07 LAB — SURGICAL PCR SCREEN
MRSA, PCR: NEGATIVE
STAPHYLOCOCCUS AUREUS: NEGATIVE

## 2018-01-07 LAB — CBC
HCT: 44 % (ref 40.0–52.0)
Hemoglobin: 15.1 g/dL (ref 13.0–18.0)
MCH: 32.1 pg (ref 26.0–34.0)
MCHC: 34.3 g/dL (ref 32.0–36.0)
MCV: 93.4 fL (ref 80.0–100.0)
Platelets: 209 10*3/uL (ref 150–440)
RBC: 4.7 MIL/uL (ref 4.40–5.90)
RDW: 14 % (ref 11.5–14.5)
WBC: 7.4 10*3/uL (ref 3.8–10.6)

## 2018-01-07 LAB — APTT: aPTT: 29 seconds (ref 24–36)

## 2018-01-07 LAB — PROTIME-INR
INR: 0.99
Prothrombin Time: 13 seconds (ref 11.4–15.2)

## 2018-01-07 NOTE — Patient Instructions (Signed)
Your procedure is scheduled on: Wednesday January 09, 2018 Report to Same Day Surgery 2nd floor medical mall ([facility] Entr[facility]-take elevator on left to 2nd floor.  Check in with surgery information desk.) To find out your arrival time please call [phone] between 1PM - 3PM on Tuesday January 08, 2018  Remember: Instructions that are not followed completely may result in serious medical risk, up to and including death, or upon the discretion of your surgeon and anesthesiologist your surgery may need to be rescheduled.    _x___ 1. Do not eat food after midnight the night before your procedure. You may drink clear liquids up to 2 hours before you are scheduled to arrive at the hospital for your procedure.  Do not drink clear liquids within 2 hours of your scheduled arrival to the hospital.  Clear liquids include  --Water or Apple juice without pulp  --Clear carbohydrate beverage such as Gatorade  --Black Coffee or Clear Tea (No milk, no creamers, do not add anything to the coffee or Tea   No gum chewing or hard candies.     __x__ 2. No Alcohol for 24 hours before or after surgery.   __x__3. No Smoking or e-cigarettes for 24 prior to surgery.  Do not use any chewable tobacco products for at least 6 hour prior to surgery   ____  4. Bring all medications with you on the day of surgery if instructed.    __x__ 5. Notify your doctor if there is any change in your medical condition     (cold, fever, infections).    x___6. On the morning of surgery brush your teeth with toothpaste and water.  You may rinse your mouth with mouth wash if you wish.  Do not swallow any toothpaste or mouthwash.   Do not wear jewelry, make-up, hairpins, clips or nail polish.  Do not wear lotions, powders, or perfumes. You may wear deodorant.  Do not shave 48 hours prior to surgery. Men may shave face and neck.  Do not bring valuables to the hospital.    [facility] is not responsible for any belongings or  valuables.               Contacts, dentures or bridgework may not be worn into surgery.  Leave your suitcase in the car. After surgery it may be brought to your room.  For patients admitted to the hospital, discharge time is determined by your                       treatment team.  _  Patients discharged the day of surgery will not be allowed to drive home.  You will need someone to drive you home and stay with you the night of your procedure.    Please read over the following fact sheets that you were given:   [facility] Preparing for Surgery and or MRSA Information   _x___ Take anti-hypertensive listed below, cardiac, seizure, asthma,     anti-reflux and psychiatric medicines. These include:  1. Amlodipine/Norvasc  2. Bupropion/Wellbutrin  3. Paroxetine/Paxil  4. Atorvastatin/Lipitor  5. Loratadine/Claritin  6. Pantoprazole/Protonix   _x___ Use CHG Soap or sage wipes as directed on instruction sheet   _x___ Follow recommendations from Cardiologist, Pulmonologist or PCP regarding stopping Aspirin, Coumadin, Plavix ,Eliquis, Effient, or Pradaxa, and Pletal.  X____Stop Anti-inflammatories such as Advil, Aleve, Ibuprofen, Motrin, Naproxen, Naprosyn, Goodies powders or aspirin products. OK to take Tylenol and Celebrex.  _x___ Stop supplements until after surgery.  But may continue Vitamin D, Vitamin B,       and multivitamin.

## 2018-01-07 NOTE — Anesthesia Pain Management Evaluation Note (Signed)
DR J ADAMS TO SEE PATIENT AFTER PRE ADMIT VISIT TODAY. PATIENT GIVEN COPY OF ENT NOTE TO TAKE TO DR ADAMS

## 2018-01-07 NOTE — Pre-Procedure Instructions (Signed)
SPOKE WITH DR Assunta Gambles RE ENT NOTE REGARDING INTUBATION ISSUES. HE WILL SEE PATIENT AFTER PRE ADMIT VISIT TODAY

## 2018-01-08 NOTE — Pre-Procedure Instructions (Signed)
SPOKE WITH DR J ADAMS AND HE ASSESSED AIRWAY WHEN SEEN 01/07/18 GOOD AIRWAY,M OK TO PROCEED

## 2018-01-08 NOTE — Anesthesia Pain Management Evaluation Note (Addendum)
SPOKE WITH DR J ADAMS WHO ASSESSED PATIENT 01/07/18. STATES AIRWAY IS FINE AND CAN PROCEED

## 2018-01-09 ENCOUNTER — Encounter
Admission: RE | Disposition: A | Discharge status: Home / Self Care | Payer: Self-pay | Source: Ambulatory Visit | Attending: Neurosurgery

## 2018-01-09 ENCOUNTER — Ambulatory Visit: Payer: BLUE CROSS/BLUE SHIELD | Admitting: Certified Registered Nurse Anesthetist

## 2018-01-09 ENCOUNTER — Observation Stay: Payer: BLUE CROSS/BLUE SHIELD

## 2018-01-09 ENCOUNTER — Other Ambulatory Visit: Payer: Self-pay

## 2018-01-09 ENCOUNTER — Ambulatory Visit: Payer: BLUE CROSS/BLUE SHIELD

## 2018-01-09 ENCOUNTER — Observation Stay
Admission: RE | Admit: 2018-01-09 | Discharge: 2018-01-10 | Disposition: A | Discharge status: Home / Self Care | Payer: BLUE CROSS/BLUE SHIELD | Source: Ambulatory Visit | Attending: Neurosurgery | Admitting: Neurosurgery

## 2018-01-09 DIAGNOSIS — Z923 Personal history of irradiation: Secondary | ICD-10-CM | POA: Insufficient documentation

## 2018-01-09 DIAGNOSIS — M2578 Osteophyte, vertebrae: Secondary | ICD-10-CM | POA: Insufficient documentation

## 2018-01-09 DIAGNOSIS — K219 Gastro-esophageal reflux disease without esophagitis: Secondary | ICD-10-CM | POA: Insufficient documentation

## 2018-01-09 DIAGNOSIS — Z85818 Personal history of malignant neoplasm of other sites of lip, oral cavity, and pharynx: Secondary | ICD-10-CM | POA: Insufficient documentation

## 2018-01-09 DIAGNOSIS — Z419 Encounter for procedure for purposes other than remedying health state, unspecified: Secondary | ICD-10-CM

## 2018-01-09 DIAGNOSIS — I10 Essential (primary) hypertension: Secondary | ICD-10-CM | POA: Insufficient documentation

## 2018-01-09 DIAGNOSIS — Z981 Arthrodesis status: Secondary | ICD-10-CM

## 2018-01-09 DIAGNOSIS — M5412 Radiculopathy, cervical region: Secondary | ICD-10-CM | POA: Diagnosis not present

## 2018-01-09 DIAGNOSIS — Z85828 Personal history of other malignant neoplasm of skin: Secondary | ICD-10-CM | POA: Insufficient documentation

## 2018-01-09 HISTORY — PX: CERVICAL DISC ARTHROPLASTY: SHX587

## 2018-01-09 HISTORY — DX: Failed or difficult intubation, initial encounter: T88.4XXA

## 2018-01-09 LAB — ABO/RH: ABO/RH(D): B POS

## 2018-01-09 SURGERY — CERVICAL ANTERIOR DISC ARTHROPLASTY
Anesthesia: General | Site: Spine Cervical | Laterality: Right | Wound class: Clean

## 2018-01-09 MED ORDER — DEXAMETHASONE SODIUM PHOSPHATE 10 MG/ML IJ SOLN
INTRAMUSCULAR | Status: AC
  Filled 2018-01-09: qty 1

## 2018-01-09 MED ORDER — MIDAZOLAM HCL 2 MG/2ML IJ SOLN
INTRAMUSCULAR | Status: DC | PRN
  Administered 2018-01-09: 2 mg via INTRAVENOUS

## 2018-01-09 MED ORDER — LIDOCAINE HCL (PF) 2 % IJ SOLN
INTRAMUSCULAR | Status: AC
  Filled 2018-01-09: qty 10

## 2018-01-09 MED ORDER — SENNOSIDES-DOCUSATE SODIUM 8.6-50 MG PO TABS
1.0000 | ORAL_TABLET | Freq: Every evening | ORAL | Status: DC | PRN
  Administered 2018-01-09: 1 via ORAL
  Filled 2018-01-09: qty 1

## 2018-01-09 MED ORDER — ROCURONIUM BROMIDE 50 MG/5ML IV SOLN
INTRAVENOUS | Status: AC
  Filled 2018-01-09: qty 1

## 2018-01-09 MED ORDER — BUPIVACAINE-EPINEPHRINE (PF) 0.5% -1:200000 IJ SOLN
INTRAMUSCULAR | Status: AC
  Filled 2018-01-09: qty 30

## 2018-01-09 MED ORDER — FENTANYL CITRATE (PF) 100 MCG/2ML IJ SOLN
INTRAMUSCULAR | Status: AC
  Administered 2018-01-09: 25 ug via INTRAVENOUS
  Filled 2018-01-09: qty 2

## 2018-01-09 MED ORDER — GELATIN ABSORBABLE 12-7 MM EX MISC
CUTANEOUS | Status: DC | PRN
  Administered 2018-01-09: 1

## 2018-01-09 MED ORDER — PROPOFOL 10 MG/ML IV BOLUS
INTRAVENOUS | Status: DC | PRN
  Administered 2018-01-09: 200 mg via INTRAVENOUS

## 2018-01-09 MED ORDER — ONDANSETRON HCL 4 MG/2ML IJ SOLN
4.0000 mg | Freq: Four times a day (QID) | INTRAMUSCULAR | Status: DC | PRN
Start: 2018-01-09 — End: 2018-01-10

## 2018-01-09 MED ORDER — ACETAMINOPHEN 650 MG RE SUPP: 650.0000 mg | RECTAL | Status: DC | PRN

## 2018-01-09 MED ORDER — ATORVASTATIN CALCIUM 20 MG PO TABS
20.0000 mg | ORAL_TABLET | Freq: Every evening | ORAL | Status: DC
  Administered 2018-01-09: 20 mg via ORAL
  Filled 2018-01-09: qty 1

## 2018-01-09 MED ORDER — SODIUM CHLORIDE 0.9% FLUSH: 3.0000 mL | INTRAVENOUS | Status: DC | PRN

## 2018-01-09 MED ORDER — PROPOFOL 10 MG/ML IV BOLUS
INTRAVENOUS | Status: AC
  Filled 2018-01-09: qty 20

## 2018-01-09 MED ORDER — CEFAZOLIN SODIUM-DEXTROSE 2-4 GM/100ML-% IV SOLN
INTRAVENOUS | Status: AC
  Filled 2018-01-09: qty 100

## 2018-01-09 MED ORDER — SODIUM CHLORIDE FLUSH 0.9 % IV SOLN
INTRAVENOUS | Status: AC
  Filled 2018-01-09: qty 10

## 2018-01-09 MED ORDER — REMIFENTANIL HCL 1 MG IV SOLR
INTRAVENOUS | Status: AC
  Filled 2018-01-09: qty 1000

## 2018-01-09 MED ORDER — EPHEDRINE SULFATE 50 MG/ML IJ SOLN
INTRAMUSCULAR | Status: DC | PRN
  Administered 2018-01-09: 15 mg via INTRAVENOUS
  Administered 2018-01-09: 10 mg via INTRAVENOUS
  Administered 2018-01-09: 15 mg via INTRAVENOUS

## 2018-01-09 MED ORDER — PANTOPRAZOLE SODIUM 40 MG PO TBEC
40.0000 mg | DELAYED_RELEASE_TABLET | Freq: Every evening | ORAL | Status: DC
  Administered 2018-01-09: 40 mg via ORAL
  Filled 2018-01-09: qty 1

## 2018-01-09 MED ORDER — SUCCINYLCHOLINE CHLORIDE 20 MG/ML IJ SOLN
INTRAMUSCULAR | Status: DC | PRN
  Administered 2018-01-09 (×2): 100 mg via INTRAVENOUS

## 2018-01-09 MED ORDER — OXYCODONE HCL 5 MG PO TABS
10.0000 mg | ORAL_TABLET | ORAL | Status: DC | PRN
  Administered 2018-01-09 – 2018-01-10 (×5): 10 mg via ORAL
  Filled 2018-01-09 (×5): qty 2

## 2018-01-09 MED ORDER — PHENYLEPHRINE HCL 10 MG/ML IJ SOLN
INTRAMUSCULAR | Status: DC | PRN
  Administered 2018-01-09 (×3): 100 ug via INTRAVENOUS

## 2018-01-09 MED ORDER — ACETAMINOPHEN 10 MG/ML IV SOLN
INTRAVENOUS | Status: AC
  Filled 2018-01-09: qty 100

## 2018-01-09 MED ORDER — ONDANSETRON HCL 4 MG/2ML IJ SOLN: 4.0000 mg | Freq: Once | INTRAMUSCULAR | Status: DC | PRN

## 2018-01-09 MED ORDER — EPHEDRINE SULFATE 50 MG/ML IJ SOLN
INTRAMUSCULAR | Status: AC
Start: 2018-01-09 — End: ?
  Filled 2018-01-09: qty 1

## 2018-01-09 MED ORDER — AMLODIPINE BESYLATE 10 MG PO TABS
10.0000 mg | ORAL_TABLET | Freq: Every day | ORAL | Status: DC
  Administered 2018-01-09 – 2018-01-10 (×2): 10 mg via ORAL
  Filled 2018-01-09 (×2): qty 1

## 2018-01-09 MED ORDER — PAROXETINE HCL 20 MG PO TABS
20.0000 mg | ORAL_TABLET | Freq: Every day | ORAL | Status: DC
  Administered 2018-01-09 – 2018-01-10 (×2): 20 mg via ORAL
  Filled 2018-01-09 (×2): qty 1

## 2018-01-09 MED ORDER — SUCCINYLCHOLINE CHLORIDE 20 MG/ML IJ SOLN
INTRAMUSCULAR | Status: AC
  Filled 2018-01-09: qty 1

## 2018-01-09 MED ORDER — LIDOCAINE HCL (CARDIAC) 20 MG/ML IV SOLN
INTRAVENOUS | Status: DC | PRN
  Administered 2018-01-09: 100 mg via INTRAVENOUS

## 2018-01-09 MED ORDER — ROCURONIUM BROMIDE 100 MG/10ML IV SOLN
INTRAVENOUS | Status: DC | PRN
  Administered 2018-01-09: 5 mg via INTRAVENOUS

## 2018-01-09 MED ORDER — THROMBIN (RECOMBINANT) 5000 UNITS EX SOLR
CUTANEOUS | Status: AC
  Filled 2018-01-09: qty 5000

## 2018-01-09 MED ORDER — DEXAMETHASONE SODIUM PHOSPHATE 10 MG/ML IJ SOLN
INTRAMUSCULAR | Status: DC | PRN
  Administered 2018-01-09: 10 mg via INTRAVENOUS
  Administered 2018-01-09: 5 mg via INTRAVENOUS

## 2018-01-09 MED ORDER — LACTATED RINGERS IV SOLN
INTRAVENOUS | Status: DC
  Administered 2018-01-09: 07:00:00 via INTRAVENOUS

## 2018-01-09 MED ORDER — ACETAMINOPHEN 500 MG PO TABS
1000.0000 mg | ORAL_TABLET | Freq: Four times a day (QID) | ORAL | Status: AC
  Administered 2018-01-09 – 2018-01-10 (×4): 1000 mg via ORAL
  Filled 2018-01-09 (×4): qty 2

## 2018-01-09 MED ORDER — ONDANSETRON HCL 4 MG/2ML IJ SOLN
INTRAMUSCULAR | Status: DC | PRN
  Administered 2018-01-09: 4 mg via INTRAVENOUS

## 2018-01-09 MED ORDER — ACETAMINOPHEN 10 MG/ML IV SOLN
INTRAVENOUS | Status: DC | PRN
  Administered 2018-01-09: 1000 mg via INTRAVENOUS

## 2018-01-09 MED ORDER — SODIUM CHLORIDE 0.9% FLUSH
3.0000 mL | Freq: Two times a day (BID) | INTRAVENOUS | Status: DC
  Administered 2018-01-09 – 2018-01-10 (×3): 3 mL via INTRAVENOUS

## 2018-01-09 MED ORDER — REMIFENTANIL HCL 1 MG IV SOLR
INTRAVENOUS | Status: DC | PRN
  Administered 2018-01-09: .1 ug/kg/min via INTRAVENOUS

## 2018-01-09 MED ORDER — DEXAMETHASONE SODIUM PHOSPHATE 4 MG/ML IJ SOLN
10.0000 mg | Freq: Three times a day (TID) | INTRAMUSCULAR | Status: DC
  Administered 2018-01-09 – 2018-01-10 (×3): 10 mg via INTRAVENOUS
  Filled 2018-01-09 (×2): qty 2.5
  Filled 2018-01-09: qty 1
  Filled 2018-01-09: qty 2.5

## 2018-01-09 MED ORDER — BACITRACIN 50000 UNITS IM SOLR
INTRAMUSCULAR | Status: AC
  Filled 2018-01-09: qty 1

## 2018-01-09 MED ORDER — FENTANYL CITRATE (PF) 250 MCG/5ML IJ SOLN
INTRAMUSCULAR | Status: AC
  Filled 2018-01-09: qty 5

## 2018-01-09 MED ORDER — BUPIVACAINE-EPINEPHRINE (PF) 0.5% -1:200000 IJ SOLN
INTRAMUSCULAR | Status: DC | PRN
  Administered 2018-01-09: 5 mL

## 2018-01-09 MED ORDER — KETAMINE HCL 50 MG/ML IJ SOLN
INTRAMUSCULAR | Status: AC
  Filled 2018-01-09: qty 10

## 2018-01-09 MED ORDER — ONDANSETRON HCL 4 MG/2ML IJ SOLN
INTRAMUSCULAR | Status: AC
  Filled 2018-01-09: qty 2

## 2018-01-09 MED ORDER — LORATADINE 10 MG PO TABS: 10.0000 mg | ORAL_TABLET | Freq: Every day | ORAL | Status: DC | PRN

## 2018-01-09 MED ORDER — FENTANYL CITRATE (PF) 100 MCG/2ML IJ SOLN
INTRAMUSCULAR | Status: AC
  Filled 2018-01-09: qty 2

## 2018-01-09 MED ORDER — PHENYLEPHRINE HCL 10 MG/ML IJ SOLN
INTRAMUSCULAR | Status: AC
  Filled 2018-01-09: qty 1

## 2018-01-09 MED ORDER — PHENOL 1.4 % MT LIQD
1.0000 | OROMUCOSAL | Status: DC | PRN
  Filled 2018-01-09: qty 177

## 2018-01-09 MED ORDER — CEFAZOLIN SODIUM-DEXTROSE 2-4 GM/100ML-% IV SOLN
2.0000 g | INTRAVENOUS | Status: AC
  Administered 2018-01-09: 2 g via INTRAVENOUS

## 2018-01-09 MED ORDER — SODIUM CHLORIDE 0.9 % IR SOLN
Status: DC | PRN
  Administered 2018-01-09: 1000 mL

## 2018-01-09 MED ORDER — FENTANYL CITRATE (PF) 100 MCG/2ML IJ SOLN
25.0000 ug | INTRAMUSCULAR | Status: DC | PRN
  Administered 2018-01-09 (×4): 25 ug via INTRAVENOUS

## 2018-01-09 MED ORDER — HYDROMORPHONE HCL 1 MG/ML IJ SOLN
INTRAMUSCULAR | Status: AC
  Administered 2018-01-09: 0.5 mg via INTRAVENOUS
  Filled 2018-01-09: qty 1

## 2018-01-09 MED ORDER — HYDROMORPHONE HCL 1 MG/ML IJ SOLN
0.5000 mg | INTRAMUSCULAR | Status: AC | PRN
  Administered 2018-01-09 (×4): 0.5 mg via INTRAVENOUS

## 2018-01-09 MED ORDER — MIDAZOLAM HCL 2 MG/2ML IJ SOLN
INTRAMUSCULAR | Status: AC
  Filled 2018-01-09: qty 2

## 2018-01-09 MED ORDER — THROMBIN (RECOMBINANT) 5000 UNITS EX SOLR
CUTANEOUS | Status: DC | PRN
  Administered 2018-01-09: 5000 [IU] via TOPICAL

## 2018-01-09 MED ORDER — OXYCODONE HCL 5 MG PO TABS
5.0000 mg | ORAL_TABLET | ORAL | Status: DC | PRN
  Filled 2018-01-09: qty 1

## 2018-01-09 MED ORDER — ACETAMINOPHEN 325 MG PO TABS: 650.0000 mg | ORAL_TABLET | ORAL | Status: DC | PRN

## 2018-01-09 MED ORDER — MECLIZINE HCL 25 MG PO TABS
25.0000 mg | ORAL_TABLET | Freq: Three times a day (TID) | ORAL | Status: DC | PRN
  Filled 2018-01-09: qty 1

## 2018-01-09 MED ORDER — ONDANSETRON HCL 4 MG PO TABS: 4.0000 mg | ORAL_TABLET | Freq: Four times a day (QID) | ORAL | Status: DC | PRN

## 2018-01-09 MED ORDER — METHOCARBAMOL 1000 MG/10ML IJ SOLN
500.0000 mg | Freq: Four times a day (QID) | INTRAVENOUS | Status: DC | PRN
  Filled 2018-01-09: qty 5

## 2018-01-09 MED ORDER — GELATIN ABSORBABLE 12-7 MM EX MISC
CUTANEOUS | Status: AC
Start: 2018-01-09 — End: ?
  Filled 2018-01-09: qty 1

## 2018-01-09 MED ORDER — METHOCARBAMOL 500 MG PO TABS
500.0000 mg | ORAL_TABLET | Freq: Four times a day (QID) | ORAL | Status: DC | PRN
  Administered 2018-01-09 – 2018-01-10 (×3): 500 mg via ORAL
  Filled 2018-01-09 (×3): qty 1

## 2018-01-09 MED ORDER — SODIUM CHLORIDE 0.9 % IV SOLN
INTRAVENOUS | Status: DC
  Administered 2018-01-09: 13:00:00 via INTRAVENOUS

## 2018-01-09 MED ORDER — MENTHOL 3 MG MT LOZG
1.0000 | LOZENGE | OROMUCOSAL | Status: DC | PRN
  Filled 2018-01-09: qty 9

## 2018-01-09 MED ORDER — SODIUM CHLORIDE 0.9 % IV SOLN
250.0000 mL | INTRAVENOUS | Status: DC
  Administered 2018-01-09: 250 mL via INTRAVENOUS

## 2018-01-09 MED ORDER — BUPROPION HCL ER (XL) 150 MG PO TB24
150.0000 mg | ORAL_TABLET | Freq: Every day | ORAL | Status: DC
  Administered 2018-01-09 – 2018-01-10 (×2): 150 mg via ORAL
  Filled 2018-01-09 (×2): qty 1

## 2018-01-09 MED ORDER — HYDROMORPHONE HCL 1 MG/ML IJ SOLN: 0.5000 mg | INTRAMUSCULAR | Status: DC | PRN

## 2018-01-09 MED ORDER — FENTANYL CITRATE (PF) 100 MCG/2ML IJ SOLN
INTRAMUSCULAR | Status: DC | PRN
  Administered 2018-01-09: 100 ug via INTRAVENOUS

## 2018-01-09 MED ORDER — KETAMINE HCL 10 MG/ML IJ SOLN
INTRAMUSCULAR | Status: DC | PRN
Start: 2018-01-09 — End: 2018-01-09
  Administered 2018-01-09: 50 mg via INTRAVENOUS

## 2018-01-09 MED ORDER — SODIUM CHLORIDE 0.9 % IV SOLN
INTRAVENOUS | Status: DC | PRN
  Administered 2018-01-09: 20 ug/min via INTRAVENOUS

## 2018-01-09 SURGICAL SUPPLY — 55 items
BLADE BOVIE TIP EXT 4 (BLADE) ×2 IMPLANT
BLADE SURG 15 STRL LF DISP TIS (BLADE) ×1 IMPLANT
BLADE SURG 15 STRL SS (BLADE) ×1
BUR NEURO DRILL SOFT 3.0X3.8M (BURR) ×2 IMPLANT
CANISTER SUCT 1200ML W/VALVE (MISCELLANEOUS) ×4 IMPLANT
CHLORAPREP W/TINT 26ML (MISCELLANEOUS) ×2 IMPLANT
COUNTER NEEDLE 20/40 LG (NEEDLE) ×2 IMPLANT
COVER LIGHT HANDLE STERIS (MISCELLANEOUS) ×4 IMPLANT
CRADLE LAMINECT ARM (MISCELLANEOUS) ×2 IMPLANT
CUP MEDICINE 2OZ PLAST GRAD ST (MISCELLANEOUS) ×4 IMPLANT
DERMABOND ADVANCED (GAUZE/BANDAGES/DRESSINGS) ×1
DERMABOND ADVANCED .7 DNX12 (GAUZE/BANDAGES/DRESSINGS) ×1 IMPLANT
DISC MOBI-C CERVICAL 15X17 H6 (Miscellaneous) ×2 IMPLANT
DRAPE C-ARM 42X72 X-RAY (DRAPES) ×4 IMPLANT
DRAPE INCISE IOBAN 66X45 STRL (DRAPES) ×2 IMPLANT
DRAPE LAPAROTOMY 77X122 PED (DRAPES) ×2 IMPLANT
DRAPE MICROSCOPE SPINE 48X150 (DRAPES) ×2 IMPLANT
DRAPE POUCH INSTRU U-SHP 10X18 (DRAPES) ×2 IMPLANT
DRAPE SHEET LG 3/4 BI-LAMINATE (DRAPES) ×2 IMPLANT
DRAPE SURG 17X11 SM STRL (DRAPES) ×8 IMPLANT
DRAPE TABLE BACK 80X90 (DRAPES) ×2 IMPLANT
ELECT CAUTERY BLADE TIP 2.5 (TIP) ×2
ELECT REM PT RETURN 9FT ADLT (ELECTROSURGICAL) ×2
ELECTRODE CAUTERY BLDE TIP 2.5 (TIP) ×1 IMPLANT
ELECTRODE REM PT RTRN 9FT ADLT (ELECTROSURGICAL) ×1 IMPLANT
FEE INTRAOP MONITOR IMPULS NCS (MISCELLANEOUS) IMPLANT
FRAME EYE SHIELD (PROTECTIVE WEAR) ×2 IMPLANT
GLOVE SURG SYN 8.5  E (GLOVE) ×3
GLOVE SURG SYN 8.5 E (GLOVE) ×3 IMPLANT
GOWN SRG XL LVL 3 NONREINFORCE (GOWNS) ×1 IMPLANT
GOWN STRL NON-REIN TWL XL LVL3 (GOWNS) ×1
GOWN STRL REUS W/ TWL LRG LVL3 (GOWN DISPOSABLE) ×2 IMPLANT
GOWN STRL REUS W/TWL LRG LVL3 (GOWN DISPOSABLE) ×2
GRADUATE 1200CC STRL 31836 (MISCELLANEOUS) ×2 IMPLANT
INTRAOP MONITOR FEE IMPULS NCS (MISCELLANEOUS)
INTRAOP MONITOR FEE IMPULSE (MISCELLANEOUS)
IV CATH ANGIO 12GX3 LT BLUE (NEEDLE) ×2 IMPLANT
KIT TURNOVER KIT A (KITS) ×2 IMPLANT
MARKER SKIN DUAL TIP RULER LAB (MISCELLANEOUS) ×4 IMPLANT
NEEDLE HYPO 22GX1.5 SAFETY (NEEDLE) ×2 IMPLANT
NS IRRIG 1000ML POUR BTL (IV SOLUTION) ×2 IMPLANT
PACK LAMINECTOMY NEURO (CUSTOM PROCEDURE TRAY) ×2 IMPLANT
PIN CASPAR SPINAL 14MM CERVICA (PIN) ×4 IMPLANT
SPOGE SURGIFLO 8M (HEMOSTASIS) ×2
SPONGE KITTNER 5P (MISCELLANEOUS) ×2 IMPLANT
SPONGE SURGIFLO 8M (HEMOSTASIS) ×2 IMPLANT
STAPLER SKIN PROX 35W (STAPLE) ×2 IMPLANT
STRIP CLOSURE SKIN 1/2X4 (GAUZE/BANDAGES/DRESSINGS) IMPLANT
SUT V-LOC 90 ABS DVC 3-0 CL (SUTURE) ×2 IMPLANT
SUT VIC AB 3-0 SH 8-18 (SUTURE) ×2 IMPLANT
SYR 30ML LL (SYRINGE) ×2 IMPLANT
TAPE ADH 3 LX (MISCELLANEOUS) ×2 IMPLANT
TOWEL OR 17X26 4PK STRL BLUE (TOWEL DISPOSABLE) ×8 IMPLANT
TRAY FOLEY W/METER SILVER 16FR (SET/KITS/TRAYS/PACK) IMPLANT
TUBING CONNECTING 10 (TUBING) ×2 IMPLANT

## 2018-01-09 NOTE — Transfer of Care (Signed)
Immediate Anesthesia Transfer of Care Note  Patient: Randall Hunter  Procedure(s) Performed: CERVICAL ANTERIOR DISC ARTHROPLASTY-C6-7 (Right Spine Cervical)  Patient Location: PACU  Anesthesia Type:General  Level of Consciousness: awake, alert , oriented and patient cooperative  Airway & Oxygen Therapy: Patient Spontanous Breathing and Patient connected to face mask oxygen  Post-op Assessment: Report given to RN and Post -op Vital signs reviewed and stable  Post vital signs: Reviewed and stable  Last Vitals:  Vitals:   01/09/18 0612 01/09/18 0956  BP: (!) 143/99 (!) 155/104  Pulse: 85 (!) 104  Resp: 16 12  Temp: 36.4 C 36.6 C  SpO2: 100% 100%    Last Pain:  Vitals:   01/09/18 0612  TempSrc: Tympanic  PainSc: 7          Complications: No apparent anesthesia complications

## 2018-01-09 NOTE — H&P (Signed)
  Heart sounds normal no MRG. Chest Clear to Auscultation Bilaterally.   I have reviewed and confirmed my history and physical from 12/28/2017 with no additions or changes. Plan for R approach for C6-7 arthroplasty.  Risks and benefits reviewed.  We have previously discussed the risks associated with his prior radiation and L radical neck dissection.  His ENT surgeon has approved a R approach.  We will take extra care in his recovery period to determine the safety of his airway.

## 2018-01-09 NOTE — Progress Notes (Signed)
Pharmacy Antibiotic Note  Randall Hunter is a 57 y.o. male admitted on (Not on file) with surgical prophylaxis.  Pharmacy has been consulted for cefazolin dosing.  Plan: Will give cefazolin 2g IV x 1 for pre-op prophylaxis     No data recorded.  Recent Labs  Lab 01/07/18 1359  WBC 7.4  CREATININE 0.95    Estimated Creatinine Clearance: 98.5 mL/min (by C-G formula based on SCr of 0.95 mg/dL).    No Known Allergies  Thank you for allowing pharmacy to be a part of this patient's care.  Tobie Lords, PharmD, BCPS Clinical Pharmacist 01/09/2018

## 2018-01-09 NOTE — Anesthesia Procedure Notes (Signed)
Procedure Name: Intubation Date/Time: 01/09/2018 7:35 AM Performed by: Eben Burow, CRNA Pre-anesthesia Checklist: Patient identified, Emergency Drugs available, Suction available, Patient being monitored and Timeout performed Patient Re-evaluated:Patient Re-evaluated prior to induction Oxygen Delivery Method: Circle system utilized Preoxygenation: Pre-oxygenation with 100% oxygen Induction Type: IV induction Ventilation: Mask ventilation without difficulty Laryngoscope Size: McGraph and 4 Grade View: Grade II Tube type: Oral Tube size: 6.5 mm Number of attempts: 3 Airway Equipment and Method: Stylet,  LTA kit utilized and Video-laryngoscopy Placement Confirmation: ETT inserted through vocal cords under direct vision,  positive ETCO2 and breath sounds checked- equal and bilateral Secured at: 20 cm Tube secured with: Tape Dental Injury: Teeth and Oropharynx as per pre-operative assessment  Difficulty Due To: Difficulty was anticipated and Difficult Airway- due to anterior larynx Future Recommendations: Recommend- induction with short-acting agent, and alternative techniques readily available

## 2018-01-09 NOTE — Progress Notes (Signed)
Chaplain spoke with patient and wife about the surgery and offered prayer and emotional support.

## 2018-01-09 NOTE — Anesthesia Preprocedure Evaluation (Signed)
Anesthesia Evaluation  Patient identified by MRN, date of birth, ID band Patient awake    Reviewed: Allergy & Precautions, H&P , NPO status , Patient's Chart, lab work & pertinent test results, reviewed documented beta blocker date and time   Airway Mallampati: III  TM Distance: >3 FB Neck ROM: full    Dental  (+) Upper Dentures, Lower Dentures   Pulmonary neg pulmonary ROS, pneumonia,    Pulmonary exam normal        Cardiovascular Exercise Tolerance: Good hypertension, On Medications negative cardio ROS Normal cardiovascular exam Rhythm:regular Rate:Normal     Neuro/Psych PSYCHIATRIC DISORDERS Depression negative neurological ROS  negative psych ROS   GI/Hepatic negative GI ROS, Neg liver ROS, GERD  Medicated,  Endo/Other  negative endocrine ROS  Renal/GU negative Renal ROS  negative genitourinary   Musculoskeletal   Abdominal   Peds  Hematology negative hematology ROS (+)   Anesthesia Other Findings Past Medical History: No date: Depression No date: GERD (gastroesophageal reflux disease) No date: Hypertension No date: Malignant tumor of neck ([place])     Comment:  left sided cervical SCC s\p resection\radion, 2014               revision surgery  No date: 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[place])     Comment:  lef sided oropharyngeal SCC s\p resection\radiation No date: Pneumonia Past Surgical History: No date: ADENOIDECTOMY No date: APPENDECTOMY 2002 and 2014: RADICAL NECK DISSECTION     Comment:  left cervical neck No date: TONSILLECTOMY BMI    Body Mass Index:  26.96 kg/m     Reproductive/Obstetrics negative OB ROS                             Anesthesia Physical Anesthesia Plan  ASA: III  Anesthesia Plan: General ETT   Post-op Pain Management:    Induction:   PONV Risk Score and Plan:   Airway Management Planned:   Additional Equipment:   Intra-op Plan:   Post-operative  Plan:   Informed Consent: I have reviewed the patients History and Physical, chart, labs and discussed the procedure including the risks, benefits and alternatives for the proposed anesthesia with the patient or authorized representative who has indicated his/her understanding and acceptance.   Dental Advisory Given  Plan Discussed with: CRNA  Anesthesia Plan Comments: (Will plan on [place] with in line stabilization.  Risks discussed with pt regarding increased risk of post op ventilatory assistance. JA)        Anesthesia Quick Evaluation

## 2018-01-09 NOTE — Progress Notes (Addendum)
  History: BARETT WHIDBEE is here s/p C6/7arthroplasty for cervical radiculopathy. Patient seen in PACU. Complains of moderate posterior cervical pain. Denies pain at incision site, pain/numbness in upper/lower extremities.   Physical Exam: Vitals:   01/09/18 1200 01/09/18 1303  BP: (!) 138/91 138/89  Pulse: (!) 107 (!) 103  Resp:    Temp: 98.9 F (37.2 C) 98.6 F (37 C)  SpO2: 95% 95%    AA Ox3 CNI Skin: incision site intact. Glue present.  Strength:5/5 throughout, sensation intact and symmetric.   Data:  Recent Labs  Lab 01/07/18 1359  NA 137  K 3.5  CL 102  CO2 27  BUN 12  CREATININE 0.95  GLUCOSE 82  CALCIUM 8.6*   No results for input(s): AST, ALT, ALKPHOS in the last 168 hours.  Invalid input(s): TBILI   Recent Labs  Lab 01/07/18 1359  WBC 7.4  HGB 15.1  HCT 44.0  PLT 209   Recent Labs  Lab 01/07/18 1359  APTT 29  INR 0.99        Assessment/Plan:  Maryruth Eve is POD0 s/p C6-7 arthroplasty. Patient is recovering well. Will monitor pain levels. Pain control with tylenol, robaxin, dexamethasone, and oxycodone. Patient to remain in upright position. Symptoms prior to surgery resolved.   - Ambulate - void - eat (assess swallowing) - imaging  Marin Olp PA-C Department of Neurosurgery

## 2018-01-09 NOTE — Op Note (Signed)
Indications: Randall Hunter is a 57 yo male who presented with worsening cervical radiculopathy.  He failed conservative management and elected for surgical intervention.  Findings: foraminal disc herniation at C6-7 on right  Preoperative Diagnosis: Cervical radiculopathy Postoperative Diagnosis: same   EBL: 20 ml IVF: 750 ml Drains: none Disposition: Extubated and Stable to PACU Complications: none  No foley catheter was placed.   Preoperative Note:   Risks of surgery discussed include: infection, bleeding, stroke, coma, death, paralysis, CSF leak, nerve/spinal cord injury, numbness, tingling, weakness, complex regional pain syndrome, recurrent stenosis and/or disc herniation, vascular injury, development of instability, neck/back pain, need for further surgery, persistent symptoms, development of deformity, and the risks of anesthesia. He understood these risks and have agreed to proceed.  Operative Note:  Procedure:  1) Cervical Disc Arthroplasty at C6/7 using a LDR Mobi-C device   Procedure: After obtaining informed consent, the patient taken to the operating room, placed in supine position, general anesthesia induced.  The patient had a small shoulder roll placed behind the neck.  The patient received preop antibiotics and IV Decadron.  The patient had a neck incision outlined, was prepped and draped in usual sterile fashion. The incision was injected with local anesthetic.   An incision was opened, dissection taken down medial to the carotid artery and jugular vein, lateral to the trachea and esophagus.  The prevertebral fascia identified and a localizing x-ray demonstrated the correct level.  The longus colli were dissected laterally, and self-retaining retractors placed to open the operative field. The microscope was then brought into the field.  Due to the history of neck dissection on the left and radiation, exposure was slightly more difficult than normal and took arpproximately 20  minutes longer than normal due to tissue adherence and abnormally distorted anatomy.  With this complete, distractor pins were placed in the vertebral bodies of C6 and C7. The distractor was placed, and the anulus at C6/7 was opened using a bovie.  Curettes and pituitary rongeurs used to remove the majority of disk, then the drill was used to remove the posterior osteophyte and begin the foraminotomies. The nerve hook was used to elevate the posterior longitudinal ligament, which was then removed with Kerrison rongeurs. The microblunt nerve hook could be passed out the foramen bilateral at each level.   Meticulous hemostasis was obtained.  A trial spacer was used to size the disc space. Using flouroscopic guidance, a 17 mm width x 15 mm depth x 6 mm height Mobi-C was then inserted in the prepared disc space.  The caspar distractor was removed, and bone wax used for hemostasis. Final AP and lateral radiographs were taken.   With the disc arthroplasty in good position, the wound was irrigated copiously with bacitracin-containing solution and meticulous hemostasis obtained.  Wound was closed in 2 layers using interrupted inverted 3-0 Vicryl sutures.  The wound was dressed with dermabond, the head of bed at 30 degrees, taken to recovery room in stable condition.  No new postop neurological deficits were identified.  Sponge and pattie counts were correct at the end of the procedure.     I performed the entire procedure with the assistance of Marin Olp PA as an Pensions consultant.  Meade Maw MD

## 2018-01-09 NOTE — Anesthesia Post-op Follow-up Note (Signed)
Anesthesia QCDR form completed.        

## 2018-01-09 NOTE — Anesthesia Postprocedure Evaluation (Signed)
Anesthesia Post Note  Patient: KEAGON GLASCOE  Procedure(s) Performed: CERVICAL ANTERIOR DISC ARTHROPLASTY-C6-7 (Right Spine Cervical)  Patient location during evaluation: PACU Anesthesia Type: General Level of consciousness: awake and alert Pain management: pain level controlled Vital Signs Assessment: post-procedure vital signs reviewed and stable Respiratory status: spontaneous breathing, nonlabored ventilation, respiratory function stable and patient connected to nasal cannula oxygen Cardiovascular status: blood pressure returned to baseline and stable Postop Assessment: no apparent nausea or vomiting Anesthetic complications: no     Last Vitals:  Vitals:   01/09/18 1111 01/09/18 1200  BP: (!) 132/94 (!) 138/91  Pulse: (!) 107 (!) 107  Resp: 15   Temp: 37.1 C 37.2 C  SpO2: 91% 95%    Last Pain:  Vitals:   01/09/18 1200  TempSrc: Oral  PainSc:                  Molli Barrows

## 2018-01-10 DIAGNOSIS — M5412 Radiculopathy, cervical region: Secondary | ICD-10-CM | POA: Diagnosis not present

## 2018-01-10 MED ORDER — OXYCODONE HCL 5 MG PO TABS: 5.0000 mg | ORAL_TABLET | ORAL | 0 refills | Status: DC | PRN

## 2018-01-10 MED ORDER — METHOCARBAMOL 500 MG PO TABS: 500.0000 mg | ORAL_TABLET | Freq: Four times a day (QID) | ORAL | 0 refills | Status: DC | PRN

## 2018-01-10 NOTE — Discharge Summary (Signed)
  History: Randall Hunter is POD1 s/p C6/7 arthroplasty. Pain present prior to surgery continues to remain resolved. Ambulated,voided, and eaten without issue. Experiencing discomfort when swallowing but that is improving. No issues tolerating liquid and soft food. Also complains of posterior cervical pain that significantly improves with medication. Current pain 3-4/10.  Denies extremity pain, weakness, or numbness.  Physical Exam: Vitals:   01/10/18 0001 01/10/18 0351  BP: 119/83 128/82  Pulse: 89 82  Resp: 18 18  Temp: 97.7 F (36.5 C) 98.2 F (36.8 C)  SpO2:      AA Ox3 CNI Skin: incision site intact. Glue present Strength:5/5 throughout except right deltoid 4+/5. Sensation intact and symmetric  Data:  Recent Labs  Lab 01/07/18 1359  NA 137  K 3.5  CL 102  CO2 27  BUN 12  CREATININE 0.95  GLUCOSE 82  CALCIUM 8.6*   No results for input(s): AST, ALT, ALKPHOS in the last 168 hours.  Invalid input(s): TBILI   Recent Labs  Lab 01/07/18 1359  WBC 7.4  HGB 15.1  HCT 44.0  PLT 209   Recent Labs  Lab 01/07/18 1359  APTT 29  INR 0.99         Other tests/results: EXAM: CERVICAL SPINE - 2-3 VIEW  COMPARISON:  Cervical MRI December 12, 2017; intraoperative cervical spine images January 09, 2018  FINDINGS: Frontal and lateral views obtained. There are disc spacers at C6-7, positioned within the C6-7 interspace region. No fracture or spondylolisthesis. Prevertebral soft tissues and predental space regions are normal. There is air anterior to the cervical spine consistent with recent surgery. Disc spaces appear normal. No erosive change.  IMPRESSION: Disc spacers at C6-7 with the C6-7 disc height within normal limits. Soft tissue air anterior to the cervical spine is felt to be of postoperative etiology. No fracture or spondylolisthesis. Currently no appreciable disc space narrowing.   Electronically Signed   By: Lowella Grip III M.D.  On: 01/09/2018 14:17   Assessment/Plan:  Randall Hunter is recovery well s/p C6/7 arthroplasty. Advised posterior cervical pain is expected at this time. Continue soft diet until regular diet is more tolerable. Post op activity restriction reviewed. Pain will continue to be controlled with oxycodone, robaxin, and tylenol. All questions and concerns were addressed.  Patient is scheduled to follow up in clinic in 2 weeks to monitor progress.  Advised to contact office if additional questions or concerns arise.   Marin Olp PA-C Department of Neurosurgery

## 2018-01-10 NOTE — Discharge Instructions (Signed)

## 2018-03-12 ENCOUNTER — Ambulatory Visit
Admission: RE | Admit: 2018-03-12 | Discharge: 2018-03-12 | Disposition: A | Discharge status: Home / Self Care | Payer: BLUE CROSS/BLUE SHIELD | Source: Ambulatory Visit | Attending: Internal Medicine | Admitting: Internal Medicine

## 2018-03-12 ENCOUNTER — Other Ambulatory Visit: Payer: Self-pay | Admitting: Internal Medicine

## 2018-03-12 DIAGNOSIS — I639 Cerebral infarction, unspecified: Secondary | ICD-10-CM

## 2018-03-12 DIAGNOSIS — I6502 Occlusion and stenosis of left vertebral artery: Secondary | ICD-10-CM | POA: Insufficient documentation

## 2018-03-12 LAB — POCT I-STAT CREATININE: Creatinine, Ser: 1.1 mg/dL (ref 0.61–1.24)

## 2018-03-12 MED ORDER — GADOBENATE DIMEGLUMINE 529 MG/ML IV SOLN
20.0000 mL | Freq: Once | INTRAVENOUS | Status: AC | PRN
  Administered 2018-03-12: 20 mL via INTRAVENOUS

## 2019-02-01 ENCOUNTER — Encounter: Payer: Self-pay | Admitting: Emergency Medicine

## 2019-02-01 ENCOUNTER — Emergency Department: Payer: BLUE CROSS/BLUE SHIELD

## 2019-02-01 ENCOUNTER — Emergency Department
Admission: EM | Admit: 2019-02-01 | Discharge: 2019-02-01 | Disposition: A | Discharge status: Home / Self Care | Payer: BLUE CROSS/BLUE SHIELD | Attending: Emergency Medicine | Admitting: Emergency Medicine

## 2019-02-01 ENCOUNTER — Other Ambulatory Visit: Payer: Self-pay

## 2019-02-01 DIAGNOSIS — M545 Low back pain, unspecified: Secondary | ICD-10-CM

## 2019-02-01 DIAGNOSIS — I10 Essential (primary) hypertension: Secondary | ICD-10-CM | POA: Insufficient documentation

## 2019-02-01 DIAGNOSIS — R1030 Lower abdominal pain, unspecified: Secondary | ICD-10-CM

## 2019-02-01 LAB — COMPREHENSIVE METABOLIC PANEL
ALBUMIN: 4.1 g/dL (ref 3.5–5.0)
ALT: 17 U/L (ref 0–44)
AST: 20 U/L (ref 15–41)
Alkaline Phosphatase: 81 U/L (ref 38–126)
Anion gap: 8 (ref 5–15)
BUN: 12 mg/dL (ref 6–20)
CO2: 25 mmol/L (ref 22–32)
Calcium: 9 mg/dL (ref 8.9–10.3)
Chloride: 105 mmol/L (ref 98–111)
Creatinine, Ser: 1.08 mg/dL (ref 0.61–1.24)
GFR calc Af Amer: >60 mL/min (ref >60)
GFR calc non Af Amer: >60 mL/min (ref >60)
GLUCOSE: 94 mg/dL (ref 70–99)
Potassium: 4 mmol/L (ref 3.5–5.1)
SODIUM: 138 mmol/L (ref 135–145)
Total Bilirubin: 1 mg/dL (ref 0.3–1.2)
Total Protein: 6.9 g/dL (ref 6.5–8.1)

## 2019-02-01 LAB — CBC WITH DIFFERENTIAL/PLATELET
ABS IMMATURE GRANULOCYTES: 0.02 10*3/uL (ref 0.00–0.07)
Basophils Absolute: 0 10*3/uL (ref 0.0–0.1)
Basophils Relative: 1 %
Eosinophils Absolute: 0 10*3/uL (ref 0.0–0.5)
Eosinophils Relative: 0 %
HCT: 42.7 % (ref 39.0–52.0)
Hemoglobin: 14.7 g/dL (ref 13.0–17.0)
Immature Granulocytes: 0 %
Lymphocytes Relative: 17 %
Lymphs Abs: 1.4 10*3/uL (ref 0.7–4.0)
MCH: 31.7 pg (ref 26.0–34.0)
MCHC: 34.4 g/dL (ref 30.0–36.0)
MCV: 92 fL (ref 80.0–100.0)
Monocytes Absolute: 0.4 10*3/uL (ref 0.1–1.0)
Monocytes Relative: 5 %
NEUTROS PCT: 77 %
Neutro Abs: 6.2 10*3/uL (ref 1.7–7.7)
Platelets: 201 10*3/uL (ref 150–400)
RBC: 4.64 MIL/uL (ref 4.22–5.81)
RDW: 13.2 % (ref 11.5–15.5)
WBC: 8.1 10*3/uL (ref 4.0–10.5)
nRBC: 0 % (ref 0.0–0.2)

## 2019-02-01 LAB — LIPASE, BLOOD: Lipase: 29 U/L (ref 11–51)

## 2019-02-01 LAB — ETHANOL: Alcohol, Ethyl (B): <10 mg/dL (ref <10)

## 2019-02-01 MED ORDER — METHYLPREDNISOLONE SODIUM SUCC 125 MG IJ SOLR
125.0000 mg | Freq: Once | INTRAMUSCULAR | Status: AC
  Administered 2019-02-01: 125 mg via INTRAVENOUS
  Filled 2019-02-01: qty 2

## 2019-02-01 MED ORDER — DIPHENHYDRAMINE HCL 50 MG/ML IJ SOLN
25.0000 mg | Freq: Once | INTRAMUSCULAR | Status: AC
  Administered 2019-02-01: 25 mg via INTRAVENOUS
  Filled 2019-02-01: qty 1

## 2019-02-01 MED ORDER — HYDROMORPHONE HCL 1 MG/ML IJ SOLN
1.0000 mg | Freq: Once | INTRAMUSCULAR | Status: AC
  Administered 2019-02-01: 1 mg via INTRAVENOUS
  Filled 2019-02-01: qty 1

## 2019-02-01 MED ORDER — OXYCODONE-ACETAMINOPHEN 5-325 MG PO TABS: 1.0000 | ORAL_TABLET | Freq: Three times a day (TID) | ORAL | 0 refills | Status: DC | PRN

## 2019-02-01 MED ORDER — IOHEXOL 350 MG/ML SOLN
75.0000 mL | Freq: Once | INTRAVENOUS | Status: AC | PRN
  Administered 2019-02-01: 75 mL via INTRAVENOUS

## 2019-02-01 MED ORDER — PREDNISONE 10 MG (21) PO TBPK: ORAL_TABLET | ORAL | 0 refills | Status: DC

## 2019-02-01 NOTE — ED Notes (Signed)
Pt verbalized understanding of discharge instructions. NAD at this time. Signature pad not working. Hard copy of signature form printed and signe by Pt's wife.

## 2019-02-01 NOTE — ED Provider Notes (Signed)
[facility] Emergency Department Provider Note       Time seen: ----------------------------------------- 12:18 PM on 02/01/2019 -----------------------------------------   I have reviewed the triage vital signs and the nursing notes.  HISTORY   Chief Complaint Back Pain and Abdominal Pain    HPI Randall Hunter is a 58 y.o. male with a history of depression, GERD, hypertension, neck tumor, oropharyngeal cancer, pneumonia who presents to the ED for low back pain that radiates around to the abdomen that began yesterday.  He denies any fevers or dysuria.  Patient states his last bowel movement was on Thursday, he has not had any blood in his stool.  He has not had pain like this before.  Pain seems to be in the lower abdomen and low mid back.  He has describes some paresthesias in his feet.  Past Medical History:  Diagnosis Date  . Depression   . Difficult intubation    Very anterior and cephalad glottis.[place] Kitchensuccessful with macgraph and styletted ett.  Marland Kitchen GERD (gastroesophageal reflux disease)   . Hypertension   . 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 Pneumonia     Patient Active Problem List   Diagnosis Date Noted  . Cervical radiculopathy 01/09/2018  . Dyspnea and respiratory abnormality 10/28/2014    Past Surgical History:  Procedure Laterality Date  . ADENOIDECTOMY    . APPENDECTOMY    . CERVICAL DISC ARTHROPLASTY Right 01/09/2018   Procedure: CERVICAL ANTERIOR [place] ARTHROPLASTY-C6-7;  Surgeon: Meade Maw, MD;  Location: [facility] ORS;  Service: Neurosurgery;  Laterality: Right;  . RADICAL NECK DISSECTION  2002 and 2014   left cervical neck  . TONSILLECTOMY      Allergies Patient has no known allergies.  Social History Social History   Tobacco Use  . Smoking status: Never Smoker   . Smokeless tobacco: Never Used  Substance Use Topics  . Alcohol use: Yes    Alcohol/week: 3.0 standard drinks    Types: 3 Cans of beer per week  . Drug use: No   Review of Systems Constitutional: Negative for fever. Cardiovascular: Negative for chest pain. Respiratory: Negative for shortness of breath. Gastrointestinal: Positive for abdominal pain, negative for vomiting or diarrhea Musculoskeletal: Positive for back pain Skin: Negative for rash. Neurological: Negative for headaches, positive for paresthesias  All systems negative/normal/unremarkable except as stated in the HPI  ____________________________________________   PHYSICAL EXAM:  VITAL SIGNS: ED Triage Vitals  Enc Vitals Group     BP 02/01/19 1212 128/85     Pulse Rate 02/01/19 1212 83     Resp 02/01/19 1212 20     Temp 02/01/19 1212 98.2 F (36.8 C)     Temp Source 02/01/19 1212 Oral     SpO2 02/01/19 1212 96 %     Weight 02/01/19 1213 220 lb (99.8 kg)     Height 02/01/19 1213 6\' 2"  (1.88 m)     Head Circumference --      Peak Flow --      Pain Score 02/01/19 1213 8     Pain Loc --      Pain Edu? --      Excl. in [place]? --    Constitutional: Alert and oriented.  No distress Eyes: Conjunctivae are normal. Normal extraocular movements. ENT      Head: Normocephalic and atraumatic.  Nose: No congestion/rhinnorhea.      Mouth/Throat: Mucous membranes are moist.      Neck: No stridor.  Previous left-sided neck surgery scars are noted Cardiovascular: Normal rate, regular rhythm. No murmurs, rubs, or gallops. Respiratory: Normal respiratory effort without tachypnea nor retractions. Breath sounds are clear and equal bilaterally. No wheezes/rales/rhonchi. Gastrointestinal: Hypoactive bowel sounds, tenderness in the midline and suprapubic area Musculoskeletal: Nontender with normal range of motion in extremities. No lower extremity tenderness nor edema.  Midline lumbosacral spine tenderness Neurologic:  Normal  speech and language. No gross focal neurologic deficits are appreciated.  Skin:  Skin is warm, dry and intact. No rash noted. Psychiatric: Mood and affect are normal. Speech and behavior are normal.   ____________________________________________  ED COURSE:  As part of my medical decision making, I reviewed the following data within the [place] History obtained from family if available, nursing notes, old chart and ekg, as well as notes from prior ED visits. Patient presented for back pain and abdominal pain, we will assess with labs and imaging as indicated at this time. Clinical Course as of Feb 01 1456  Sat Feb 01, 2019  1405 Labs are reassuring to this point   [JW]    Clinical Course User Index [JW] Earleen Newport, MD   Procedures ____________________________________________   LABS (pertinent positives/negatives)  Labs Reviewed  CBC WITH DIFFERENTIAL/PLATELET  COMPREHENSIVE METABOLIC PANEL  LIPASE, BLOOD  URINALYSIS, COMPLETE (UACMP) WITH MICROSCOPIC  ETHANOL    RADIOLOGY Images were viewed by me  CT the abdomen pelvis with contrast IMPRESSION: No acute findings within the abdomen or pelvis. No evidence of acute solid organ abnormality. No bowel obstruction or evidence of bowel wall inflammation. No renal or ureteral calculi.  Aortic Atherosclerosis (ICD10-I70.0). ____________________________________________   DIFFERENTIAL DIAGNOSIS   Pancreatitis, gastritis, metastasis, diverticulitis, AAA, herniated disc, sciatica  FINAL ASSESSMENT AND PLAN  Abdominal pain, back pain   Plan: The patient had presented for back pain and abdominal pain. Patient's labs did not reveal any acute process. Patient's imaging was reassuring.  No clear etiology for his symptoms, likely multifactorial.  Likely degenerative disc disease or arthritis in addition to constipation and/or gastritis.  He is cleared for outpatient follow-up with his  doctor.   Laurence Aly, MD    Note: This note was generated in part or whole with voice recognition software. Voice recognition is usually quite accurate but there are transcription errors that can and very often do occur. I apologize for any typographical errors that were not detected and corrected.     Earleen Newport, MD 02/01/19 [phone]

## 2019-02-01 NOTE — ED Notes (Signed)
Signature pad not working. Hard copy printed an signed by pt's wife.

## 2019-02-01 NOTE — ED Triage Notes (Signed)
Low back pain radiating around to abdomen began yesterday. Denies fevers or dysuria.

## 2019-08-29 ENCOUNTER — Ambulatory Visit
Admission: RE | Admit: 2019-08-29 | Discharge: 2019-08-29 | Disposition: A | Discharge status: Home / Self Care | Payer: BLUE CROSS/BLUE SHIELD | Source: Ambulatory Visit | Attending: Internal Medicine | Admitting: Internal Medicine

## 2019-08-29 ENCOUNTER — Other Ambulatory Visit: Payer: Self-pay | Admitting: Internal Medicine

## 2019-08-29 ENCOUNTER — Other Ambulatory Visit: Payer: Self-pay

## 2019-08-29 DIAGNOSIS — M4646 Discitis, unspecified, lumbar region: Secondary | ICD-10-CM

## 2019-09-19 ENCOUNTER — Other Ambulatory Visit: Payer: Self-pay | Admitting: Internal Medicine

## 2019-09-19 ENCOUNTER — Other Ambulatory Visit: Payer: Self-pay

## 2019-09-19 ENCOUNTER — Ambulatory Visit
Admission: RE | Admit: 2019-09-19 | Discharge: 2019-09-19 | Disposition: A | Discharge status: Home / Self Care | Payer: BLUE CROSS/BLUE SHIELD | Source: Ambulatory Visit | Attending: Internal Medicine | Admitting: Internal Medicine

## 2019-09-19 DIAGNOSIS — R0600 Dyspnea, unspecified: Secondary | ICD-10-CM | POA: Insufficient documentation

## 2019-09-19 MED ORDER — IOHEXOL 350 MG/ML SOLN
75.0000 mL | Freq: Once | INTRAVENOUS | Status: AC | PRN
  Administered 2019-09-19: 11:00:00 75 mL via INTRAVENOUS

## 2019-09-26 ENCOUNTER — Other Ambulatory Visit: Payer: Self-pay

## 2019-09-26 ENCOUNTER — Other Ambulatory Visit: Payer: Self-pay | Admitting: Pulmonary Disease

## 2019-09-26 DIAGNOSIS — J42 Unspecified chronic bronchitis: Secondary | ICD-10-CM

## 2019-10-01 ENCOUNTER — Ambulatory Visit
Admission: RE | Admit: 2019-10-01 | Discharge: 2019-10-01 | Disposition: A | Discharge status: Home / Self Care | Payer: BLUE CROSS/BLUE SHIELD | Source: Ambulatory Visit | Attending: Pulmonary Disease | Admitting: Pulmonary Disease

## 2019-10-01 ENCOUNTER — Other Ambulatory Visit: Payer: Self-pay

## 2019-10-01 DIAGNOSIS — J42 Unspecified chronic bronchitis: Secondary | ICD-10-CM | POA: Diagnosis not present

## 2019-11-11 ENCOUNTER — Other Ambulatory Visit: Payer: Self-pay | Admitting: Internal Medicine

## 2019-11-11 ENCOUNTER — Other Ambulatory Visit (HOSPITAL_COMMUNITY): Payer: Self-pay | Admitting: Internal Medicine

## 2019-11-11 DIAGNOSIS — R0609 Other forms of dyspnea: Secondary | ICD-10-CM

## 2019-11-11 DIAGNOSIS — J849 Interstitial pulmonary disease, unspecified: Secondary | ICD-10-CM

## 2019-11-18 ENCOUNTER — Ambulatory Visit: Payer: BLUE CROSS/BLUE SHIELD

## 2019-12-18 ENCOUNTER — Other Ambulatory Visit
Admission: RE | Admit: 2019-12-18 | Discharge: 2019-12-18 | Disposition: A | Discharge status: Home / Self Care | Payer: BLUE CROSS/BLUE SHIELD | Source: Ambulatory Visit | Attending: Internal Medicine | Admitting: Internal Medicine

## 2019-12-18 DIAGNOSIS — Z01812 Encounter for preprocedural laboratory examination: Secondary | ICD-10-CM | POA: Insufficient documentation

## 2019-12-18 LAB — BRAIN NATRIURETIC PEPTIDE: B Natriuretic Peptide: 25 pg/mL (ref 0.0–100.0)

## 2019-12-30 ENCOUNTER — Other Ambulatory Visit: Payer: BLUE CROSS/BLUE SHIELD | Attending: Internal Medicine

## 2020-01-01 ENCOUNTER — Encounter: Admission: RE | Payer: Self-pay | Source: Home / Self Care

## 2020-01-01 ENCOUNTER — Ambulatory Visit
Admission: RE | Admit: 2020-01-01 | Payer: BLUE CROSS/BLUE SHIELD | Source: Home / Self Care | Admitting: Internal Medicine

## 2020-01-01 SURGERY — LEFT HEART CATH AND CORONARY ANGIOGRAPHY
Anesthesia: Moderate Sedation | Laterality: Left

## 2020-01-19 ENCOUNTER — Other Ambulatory Visit
Admission: RE | Admit: 2020-01-19 | Discharge: 2020-01-19 | Disposition: A | Discharge status: Home / Self Care | Payer: BLUE CROSS/BLUE SHIELD | Source: Ambulatory Visit | Attending: Pulmonary Disease | Admitting: Pulmonary Disease

## 2020-01-19 DIAGNOSIS — R0600 Dyspnea, unspecified: Secondary | ICD-10-CM | POA: Diagnosis present

## 2020-01-19 LAB — FIBRIN DERIVATIVES D-DIMER (ARMC ONLY): Fibrin derivatives D-dimer (ARMC): 245.23 ng/mL (FEU) (ref 0.00–499.00)

## 2020-07-27 LAB — EXTERNAL GENERIC LAB PROCEDURE: COLOGUARD: NEGATIVE

## 2021-02-19 ENCOUNTER — Emergency Department
Admission: EM | Admit: 2021-02-19 | Discharge: 2021-02-19 | Disposition: A | Discharge status: Home / Self Care | Payer: BLUE CROSS/BLUE SHIELD | Attending: Emergency Medicine | Admitting: Emergency Medicine

## 2021-02-19 ENCOUNTER — Emergency Department: Payer: BLUE CROSS/BLUE SHIELD

## 2021-02-19 ENCOUNTER — Encounter: Payer: Self-pay | Admitting: Emergency Medicine

## 2021-02-19 ENCOUNTER — Other Ambulatory Visit: Payer: Self-pay

## 2021-02-19 DIAGNOSIS — M5442 Lumbago with sciatica, left side: Secondary | ICD-10-CM | POA: Insufficient documentation

## 2021-02-19 DIAGNOSIS — I10 Essential (primary) hypertension: Secondary | ICD-10-CM | POA: Diagnosis not present

## 2021-02-19 DIAGNOSIS — Z79899 Other long term (current) drug therapy: Secondary | ICD-10-CM | POA: Diagnosis not present

## 2021-02-19 DIAGNOSIS — X500XXA Overexertion from strenuous movement or load, initial encounter: Secondary | ICD-10-CM | POA: Insufficient documentation

## 2021-02-19 DIAGNOSIS — M545 Low back pain, unspecified: Secondary | ICD-10-CM | POA: Diagnosis present

## 2021-02-19 DIAGNOSIS — Z8589 Personal history of malignant neoplasm of other organs and systems: Secondary | ICD-10-CM | POA: Diagnosis not present

## 2021-02-19 DIAGNOSIS — Z7982 Long term (current) use of aspirin: Secondary | ICD-10-CM | POA: Diagnosis not present

## 2021-02-19 LAB — URINALYSIS, COMPLETE (UACMP) WITH MICROSCOPIC
Bacteria, UA: NONE SEEN
Bilirubin Urine: NEGATIVE
Glucose, UA: NEGATIVE mg/dL
Hgb urine dipstick: NEGATIVE
Ketones, ur: NEGATIVE mg/dL
Leukocytes,Ua: NEGATIVE
Nitrite: NEGATIVE
Protein, ur: NEGATIVE mg/dL
Specific Gravity, Urine: 1.006 (ref 1.005–1.030)
Squamous Epithelial / HPF: NONE SEEN (ref 0–5)
pH: 7 (ref 5.0–8.0)

## 2021-02-19 MED ORDER — KETOROLAC TROMETHAMINE 10 MG PO TABS: 10.0000 mg | ORAL_TABLET | Freq: Four times a day (QID) | ORAL | 0 refills | Status: AC | PRN

## 2021-02-19 MED ORDER — KETOROLAC TROMETHAMINE 60 MG/2ML IM SOLN
30.0000 mg | Freq: Once | INTRAMUSCULAR | Status: AC
  Administered 2021-02-19: 30 mg via INTRAMUSCULAR
  Filled 2021-02-19: qty 2

## 2021-02-19 MED ORDER — ACETAMINOPHEN 325 MG PO TABS
650.0000 mg | ORAL_TABLET | Freq: Once | ORAL | Status: AC
  Administered 2021-02-19: 650 mg via ORAL
  Filled 2021-02-19: qty 2

## 2021-02-19 NOTE — Discharge Instructions (Addendum)
Please take the toradol has prescribed. Continue your flexeril as previously prescribed. You may add Tylenol, up to 650 additional mg per dose to your oxycodone you are already taking. If you choose to forego a dose of percocet, you can take up to 1000mg  of Tylenol. Please return for any worsening or changes.

## 2021-02-19 NOTE — ED Provider Notes (Signed)
[facility] Emergency Department Provider Note  ____________________________________________   Event Date/Time   First MD Initiated Contact with Patient 02/19/21 1452     (approximate)  I have reviewed the triage vital signs and the nursing notes.   HISTORY  Chief Complaint Back Pain  HPI Randall Hunter is a 60 y.o. male who presents to the emergency department for evaluation of low back pain since Wednesday.  Patient denies any injury or fall as the precipitating event.  He states that on Tuesday, he lifted a heavy box, did not notice any significant pain at the time, but had acute onset early on Wednesday.  He was seen on Thursday at [facility], prescribed Flexeril and oxycodone, but states that while this was initially helping, he has taken 2 of each of these today without relief.  He denies any loss of bowel or bladder control, denies fever, denies any flank pain or urinary symptoms.  Of note, he does have history of neck malignancy with excision, did not have any metastatic spread when this was treated.  He denies any other significant back pain history.         Past Medical History:  Diagnosis Date  . Depression   . Difficult intubation    Very anterior and cephalad glottis.Marland Kitchensuccessful with macgraph and styletted ett.  Marland Kitchen GERD (gastroesophageal reflux disease)   . Hypertension   . 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 Pneumonia     Patient Active Problem List   Diagnosis Date Noted  . Cervical radiculopathy 01/09/2018  . Dyspnea and respiratory abnormality 10/28/2014    Past Surgical History:  Procedure Laterality Date  . ADENOIDECTOMY    . APPENDECTOMY    . CERVICAL DISC ARTHROPLASTY Right 01/09/2018   Procedure: CERVICAL ANTERIOR [place] ARTHROPLASTY-C6-7;  Surgeon: Meade Maw, MD;  Location: [facility] ORS;  Service: Neurosurgery;  Laterality: Right;  . RADICAL NECK DISSECTION  2002 and 2014   left cervical neck  . TONSILLECTOMY      Prior to Admission medications   Medication Sig Start Date End Date Taking? Authorizing Provider  ketorolac (TORADOL) 10 MG tablet Take 1 tablet (10 mg total) by mouth every 6 (six) hours as needed for up to 5 days. 02/19/21 02/24/21 Yes Marlana Salvage, PA  acetaminophen (TYLENOL) 500 MG tablet Take 500 mg by mouth daily as needed for moderate pain or headache.    [provider]  amLODipine (NORVASC) 10 MG tablet Take 10 mg by mouth daily.    [provider]  aspirin 81 MG chewable tablet Chew 81 mg by mouth daily.    [provider]  atorvastatin (LIPITOR) 20 MG tablet Take 20 mg by mouth every evening.    [provider]  buPROPion (WELLBUTRIN XL) 150 MG 24 hr tablet Take 150 mg by mouth daily.    [provider]  Cyanocobalamin (B-12) 2500 MCG SUBL Place 2,500 mcg under the tongue every Sunday.    [provider]  ibuprofen (ADVIL,MOTRIN) 200 MG tablet Take 600 mg by mouth daily as needed for headache.    [provider]  loratadine (CLARITIN) 10 MG tablet Take 10 mg by mouth daily as needed for allergies.    [provider]  olmesartan (BENICAR) 20 MG tablet Take 20 mg by mouth daily. 04/04/18 04/04/19  [provider]  oxyCODONE-acetaminophen (PERCOCET) 5-325 MG tablet Take 1 tablet by mouth every 8 (eight) hours as needed. 02/01/19   Earleen Newport, MD  pantoprazole (PROTONIX) 40 MG tablet Take 40 mg by mouth every evening.     [provider]  PARoxetine (PAXIL) 20 MG tablet Take 20 mg by mouth daily. 05/25/14   [provider]  predniSONE (STERAPRED UNI-PAK 21 TAB) 10 MG (21) TBPK tablet Dispense steroid taper pack as directed 02/01/19   Earleen Newport, MD    Allergies Patient has no known allergies.  History  reviewed. No pertinent family history.  Social History Social History   Tobacco Use  . Smoking status: Never Smoker  . Smokeless tobacco: Never Used  Vaping Use  . Vaping Use: Never used  Substance Use Topics  . Alcohol use: Yes    Alcohol/week: 3.0 standard drinks    Types: 3 Cans of beer per week  . Drug use: No    Review of Systems Constitutional: No fever/chills Eyes: No visual changes. ENT: No sore throat. Cardiovascular: Denies chest pain. Respiratory: Denies shortness of breath. Gastrointestinal: No abdominal pain.  No nausea, no vomiting.  No diarrhea.  No constipation. Genitourinary: Negative for dysuria. Musculoskeletal: +for back pain. Skin: Negative for rash. Neurological: Negative for headaches, focal weakness or numbness.  ____________________________________________   PHYSICAL EXAM:  VITAL SIGNS: ED Triage Vitals  Enc Vitals Group     BP 02/19/21 1441 129/83     Pulse Rate 02/19/21 1441 64     Resp 02/19/21 1441 18     Temp 02/19/21 1441 98.1 F (36.7 C)     Temp src --      SpO2 02/19/21 1441 100 %     Weight 02/19/21 1442 215 lb (97.5 kg)     Height 02/19/21 1442 6\' 2"  (1.88 m)     Head Circumference --      Peak Flow --      Pain Score 02/19/21 1442 8     Pain Loc --      Pain Edu? --      Excl. in [place]? --    Constitutional: Alert and oriented. Well appearing and in no acute distress. Eyes: Conjunctivae are normal. PERRL. EOMI. Head: Atraumatic. Nose: No congestion/rhinnorhea. Mouth/Throat: Mucous membranes are moist.  Oropharynx non-erythematous. Neck: No stridor.   Cardiovascular: Normal rate, regular rhythm. Grossly normal heart sounds.  Good peripheral circulation. Respiratory: Normal respiratory effort.  No retractions. Lungs CTAB. Gastrointestinal: Soft and nontender. No distention. No abdominal bruits. No CVA tenderness. Musculoskeletal: There is tenderness over the midline of the lumbar spine as well as the bilateral paraspinal  musculature.  Patient has equal and symmetric bilateral lower extremity strength in ankle plantarflexion and dorsiflexion, knee flexion extension and hip flexion.  Dorsal pedal pulses 2+ bilaterally, color and temperature are equal bilaterally. Neurologic:  Normal speech and language. No gross focal neurologic deficits are appreciated. No gait instability. Skin:  Skin is warm, dry and intact. No rash noted. Psychiatric: Mood and affect are normal. Speech and behavior are normal.  ____________________________________________   LABS (all labs ordered are listed, but only abnormal results are displayed)  Labs Reviewed  URINALYSIS, COMPLETE (UACMP) WITH MICROSCOPIC - Abnormal; Notable for the following components:      Result Value   Color, Urine STRAW (*)    APPearance CLEAR (*)    All other components within normal limits    ____________________________________________  RADIOLOGY I, Marlana Salvage,  personally viewed and evaluated these images (plain radiographs) as part of my medical decision making, as well as reviewing the written report by the radiologist.  ED provider interpretation: No acute fractures identified, mild degenerative changes noted.  Official radiology report(s): DG Lumbar Spine 2-3 Views  Result Date: 02/19/2021 CLINICAL DATA:  Low back pain EXAM: LUMBAR SPINE - 2-3 VIEW COMPARISON:  None. FINDINGS: There is no evidence of lumbar spine fracture. Alignment is normal. Mild loss of disc height noted at L4-5 and L5-S1. IMPRESSION: No acute findings. Electronically Signed   By: Misty Stanley M.D.   On: 02/19/2021 15:45   ____________________________________________   INITIAL IMPRESSION / ASSESSMENT AND PLAN / ED COURSE  As part of my medical decision making, I reviewed the following data within the [place] notes reviewed and incorporated, Radiograph reviewed and Notes from prior ED visits        Patient is a 60 year old male who  presents to the emergency department for evaluation of low back pain, see HPI for further details.  On physical exam, he is neurovascular intact, has equal and symmetric strength bilaterally.  No CVA tenderness.  The patient has been taking Flexeril and oxycodone and admits to some relief with this early on, but then worsened again.  Discussed anti-inflammatory with the patient.  He denies history of diabetes or kidney disease, discussed the options of a prednisone taper pack versus Toradol.  Patient elects for trial of Toradol IM as well as 5 days of Toradol tablets for home.  He was offered to change his muscle relaxant to Robaxin, however he declines this at this time.  He is also instructed on appropriate Tylenol dosages he can take if using with the Percocet previously prescribed.  Patient is amenable with this plan.  Return precautions were discussed and he is stable at this time for outpatient follow-up.      ____________________________________________   FINAL CLINICAL IMPRESSION(S) / ED DIAGNOSES  Final diagnoses:  Acute left-sided low back pain with left-sided sciatica     ED Discharge Orders         Ordered    ketorolac (TORADOL) 10 MG tablet  Every 6 hours PRN        02/19/21 1607          *Please note:  Randall Hunter was evaluated in Emergency Department on 02/19/2021 for the symptoms described in the history of present illness. He was evaluated in the context of the global COVID-19 pandemic, which necessitated consideration that the patient might be at risk for infection with the SARS-CoV-2 virus that causes COVID-19. Institutional protocols and algorithms that pertain to the evaluation of patients at risk for COVID-19 are in a state of rapid change based on information released by regulatory bodies including the CDC and federal and state organizations. These policies and algorithms were followed during the patient's care in the ED.  Some ED evaluations and interventions may be  delayed as a result of limited staffing during and the pandemic.*   Note:  This document was prepared using Dragon voice recognition software and may include unintentional dictation errors.   Marlana Salvage, PA 02/19/21 2311    Nance Pear, MD 02/19/21 [phone]

## 2021-02-19 NOTE — ED Triage Notes (Signed)
Pt via POV from home. Pt c/o lower back pain since Wednesday. Pt was seen at [facility] on Thursday and prescribed medicines that have not worked. Pt denies any strenuous work. Pt is A&Ox4 and NAD.

## 2021-04-30 IMAGING — CR DG LUMBAR SPINE 2-3V
1 series · 3 of 3 positions shown · non-contrast
Comparison: None.

CLINICAL DATA: Low back pain

EXAM:
LUMBAR SPINE - 2-3 VIEW

[Series 1: dg lumbar spine 2-3 views · 0.14mm/px · 3 of 3 slices shown]
[im 1/3]
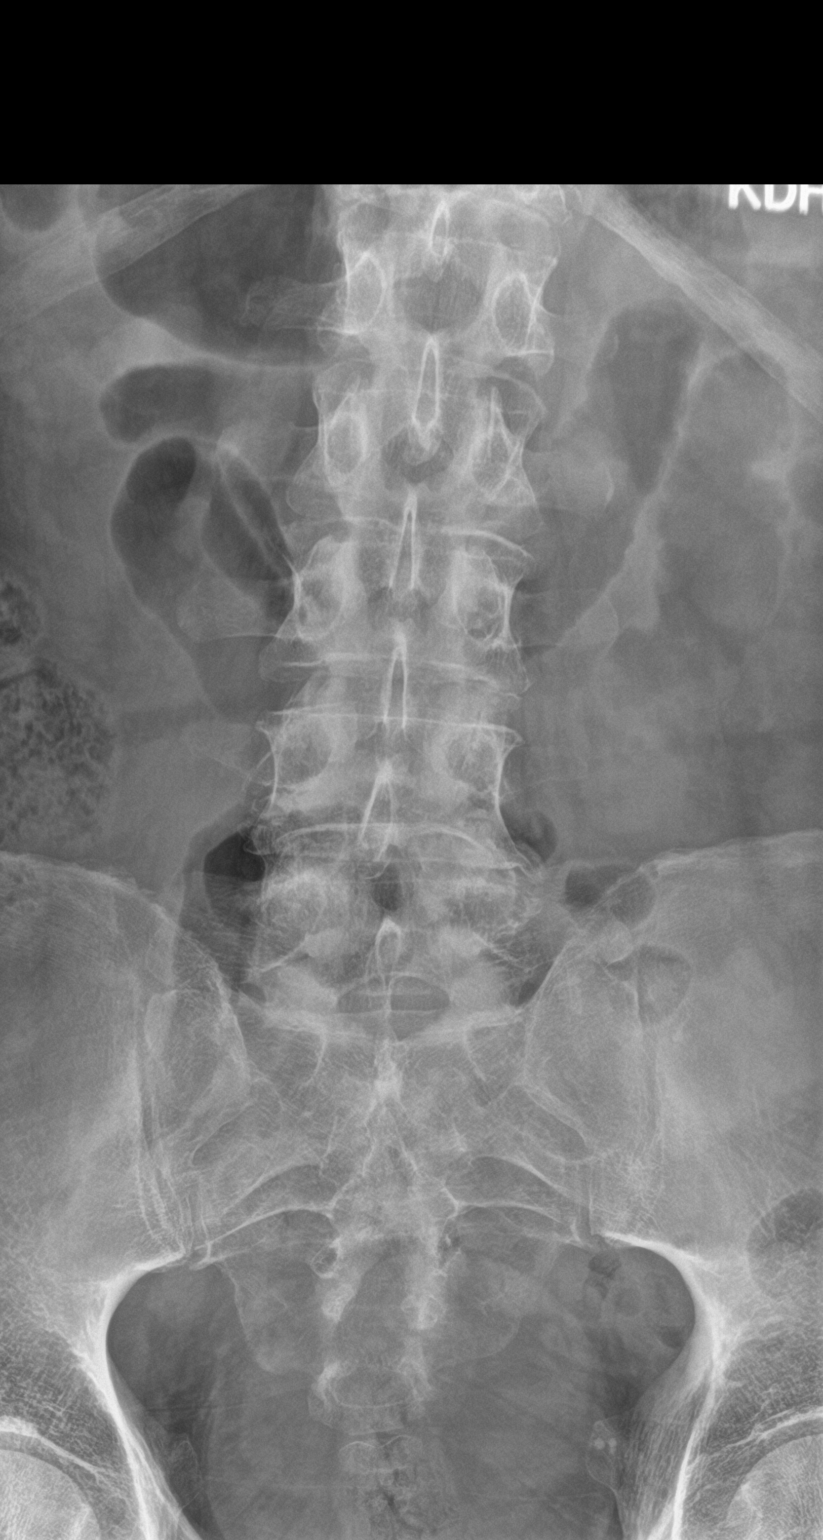
[im 2/3]
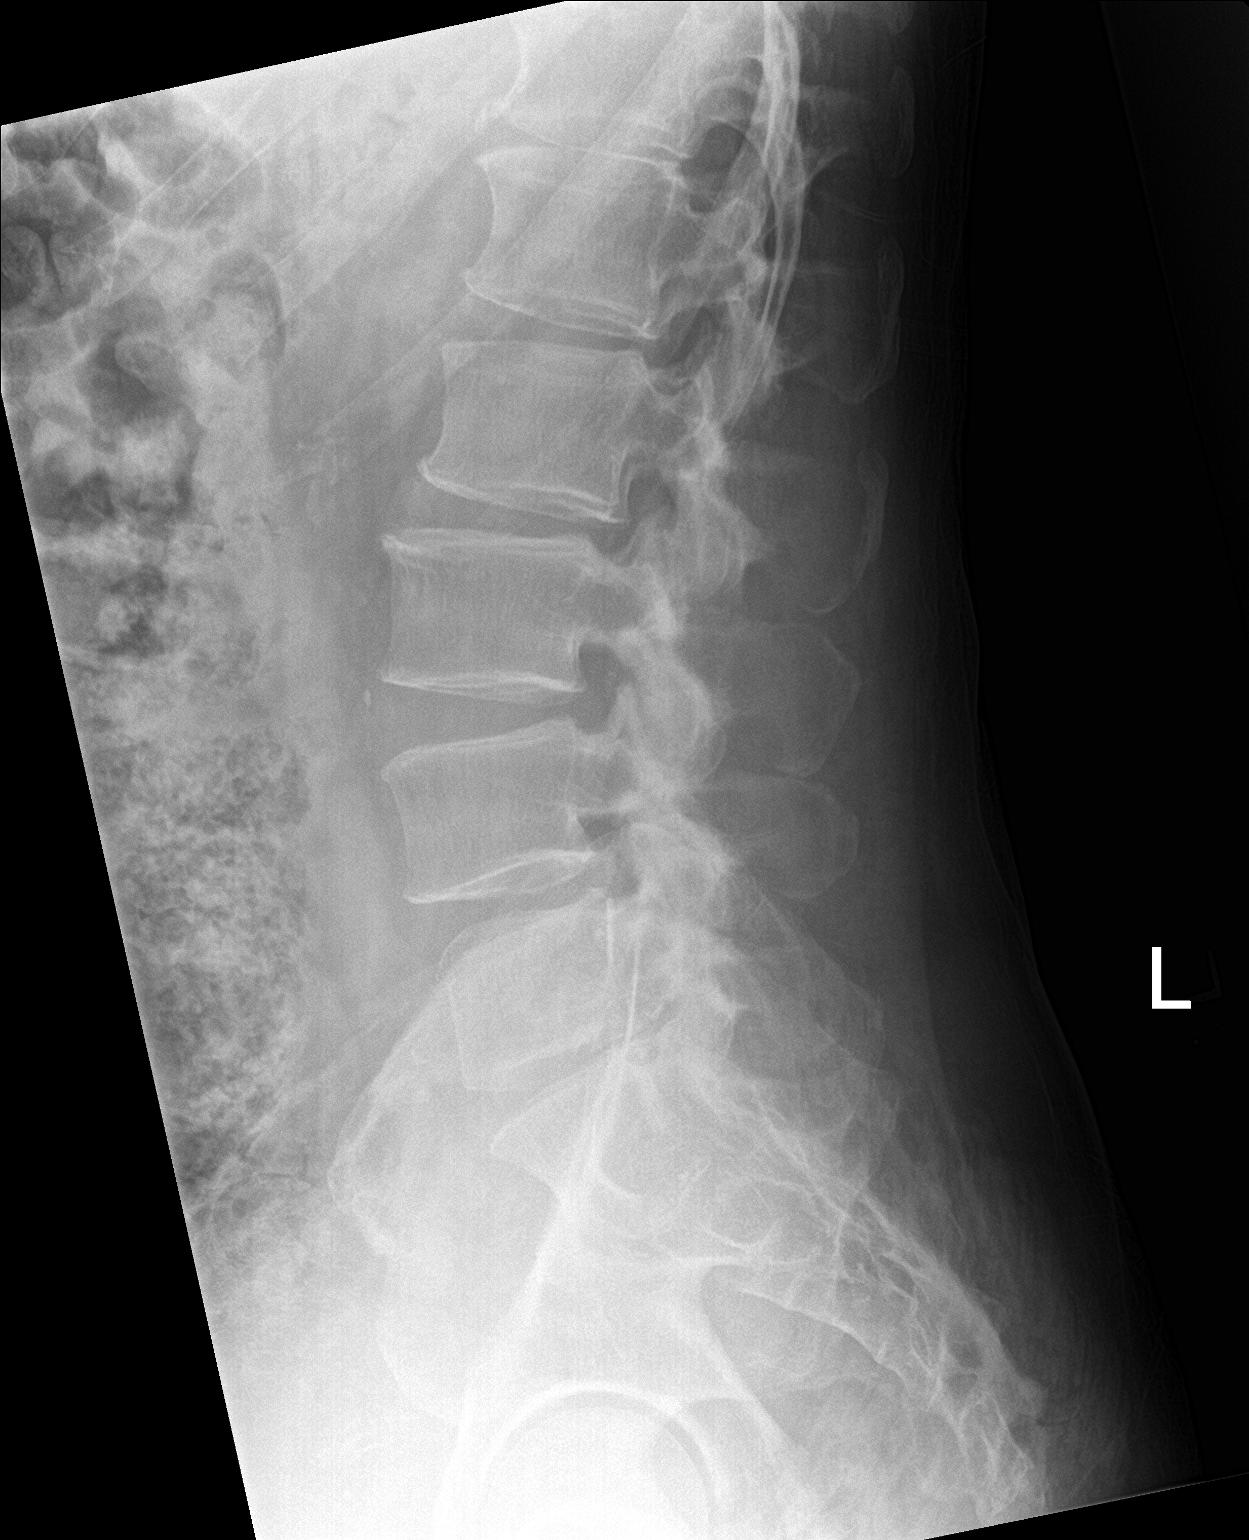
[im 3/3]
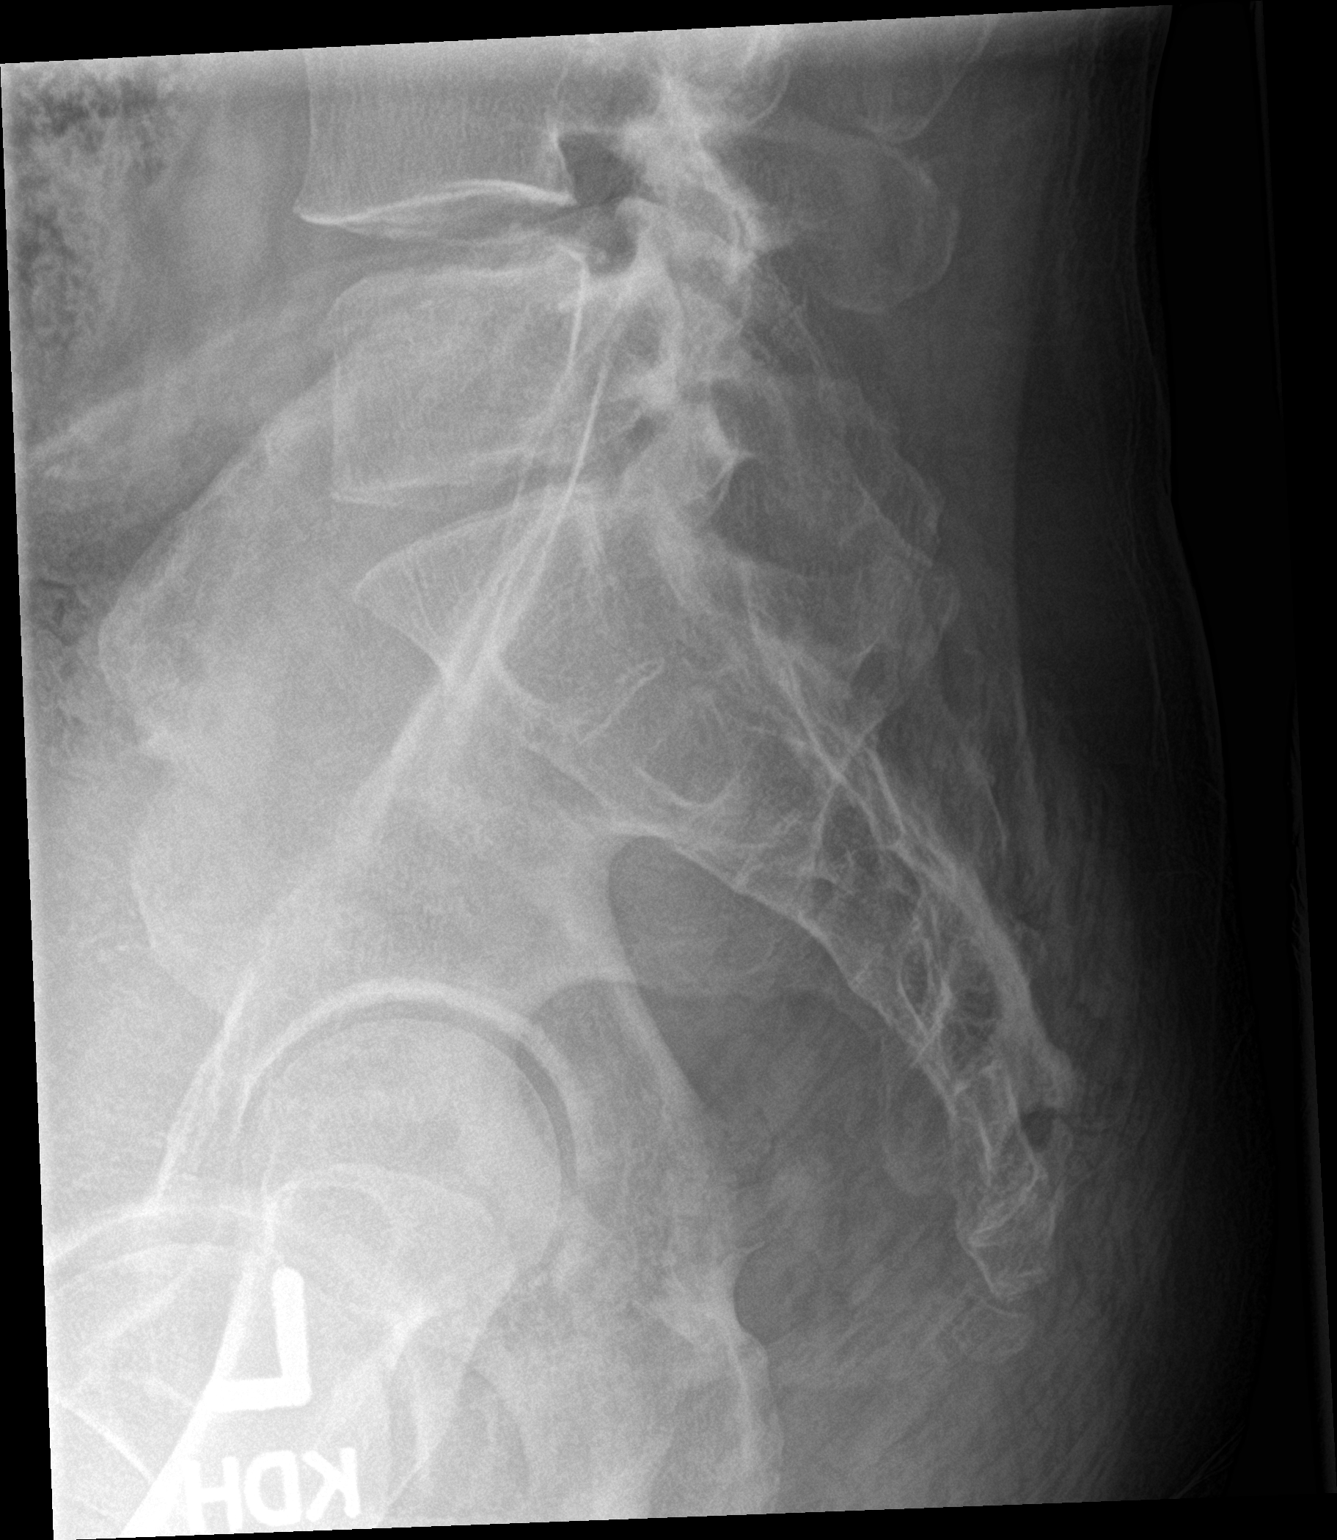

[3 of 3 positions shown; findings below may reference images not displayed]

FINDINGS: There is no evidence of lumbar spine fracture. Alignment is normal.
Mild loss of disc height noted at L4-5 and L5-S1.
IMPRESSION: No acute findings.

## 2023-09-03 LAB — COLOGUARD: COLOGUARD: NEGATIVE

## 2023-09-03 LAB — EXTERNAL GENERIC LAB PROCEDURE: COLOGUARD: NEGATIVE

## 2024-03-15 ENCOUNTER — Emergency Department
Admission: EM | Admit: 2024-03-15 | Discharge: 2024-03-16 | Disposition: A | Discharge status: Home / Self Care | Attending: Emergency Medicine | Admitting: Emergency Medicine

## 2024-03-15 ENCOUNTER — Emergency Department

## 2024-03-15 ENCOUNTER — Encounter: Payer: Self-pay | Admitting: Emergency Medicine

## 2024-03-15 ENCOUNTER — Other Ambulatory Visit: Payer: Self-pay

## 2024-03-15 DIAGNOSIS — R059 Cough, unspecified: Secondary | ICD-10-CM | POA: Diagnosis not present

## 2024-03-15 DIAGNOSIS — J4521 Mild intermittent asthma with (acute) exacerbation: Secondary | ICD-10-CM | POA: Diagnosis not present

## 2024-03-15 DIAGNOSIS — R0789 Other chest pain: Secondary | ICD-10-CM | POA: Diagnosis present

## 2024-03-15 DIAGNOSIS — R0602 Shortness of breath: Secondary | ICD-10-CM | POA: Insufficient documentation

## 2024-03-15 DIAGNOSIS — D72829 Elevated white blood cell count, unspecified: Secondary | ICD-10-CM | POA: Insufficient documentation

## 2024-03-15 DIAGNOSIS — Z85818 Personal history of malignant neoplasm of other sites of lip, oral cavity, and pharynx: Secondary | ICD-10-CM | POA: Diagnosis not present

## 2024-03-15 LAB — CBC
HCT: 44.8 % (ref 39.0–52.0)
Hemoglobin: 14.7 g/dL (ref 13.0–17.0)
MCH: 29.6 pg (ref 26.0–34.0)
MCHC: 32.8 g/dL (ref 30.0–36.0)
MCV: 90.3 fL (ref 80.0–100.0)
Platelets: 318 10*3/uL (ref 150–400)
RBC: 4.96 MIL/uL (ref 4.22–5.81)
RDW: 13.7 % (ref 11.5–15.5)
WBC: 11.2 10*3/uL — ABNORMAL HIGH (ref 4.0–10.5)
nRBC: 0 % (ref 0.0–0.2)

## 2024-03-15 LAB — RESP PANEL BY RT-PCR (RSV, FLU A&B, COVID)  RVPGX2
Influenza A by PCR: NEGATIVE
Influenza B by PCR: NEGATIVE
Resp Syncytial Virus by PCR: NEGATIVE
SARS Coronavirus 2 by RT PCR: NEGATIVE

## 2024-03-15 LAB — BASIC METABOLIC PANEL WITH GFR
Anion gap: 8 (ref 5–15)
BUN: 12 mg/dL (ref 8–23)
CO2: 26 mmol/L (ref 22–32)
Calcium: 8.9 mg/dL (ref 8.9–10.3)
Chloride: 105 mmol/L (ref 98–111)
Creatinine, Ser: 0.97 mg/dL (ref 0.61–1.24)
GFR, Estimated: >60 mL/min (ref >60)
Glucose, Bld: 108 mg/dL — ABNORMAL HIGH (ref 70–99)
Potassium: 4.1 mmol/L (ref 3.5–5.1)
Sodium: 139 mmol/L (ref 135–145)

## 2024-03-15 MED ORDER — IPRATROPIUM-ALBUTEROL 0.5-2.5 (3) MG/3ML IN SOLN
6.0000 mL | Freq: Once | RESPIRATORY_TRACT | Status: AC
  Administered 2024-03-15: 6 mL via RESPIRATORY_TRACT
  Filled 2024-03-15: qty 6

## 2024-03-15 MED ORDER — BUDESONIDE 0.5 MG/2ML IN SUSP
0.5000 mg | Freq: Once | RESPIRATORY_TRACT | Status: AC
  Administered 2024-03-15: 0.5 mg via RESPIRATORY_TRACT
  Filled 2024-03-15: qty 2

## 2024-03-15 MED ORDER — IOHEXOL 350 MG/ML SOLN
100.0000 mL | Freq: Once | INTRAVENOUS | Status: AC | PRN
  Administered 2024-03-16: 100 mL via INTRAVENOUS

## 2024-03-15 MED ORDER — METHYLPREDNISOLONE SODIUM SUCC 125 MG IJ SOLR
125.0000 mg | Freq: Once | INTRAMUSCULAR | Status: AC
  Administered 2024-03-15: 125 mg via INTRAVENOUS
  Filled 2024-03-15: qty 2

## 2024-03-15 NOTE — ED Provider Notes (Signed)
 [facility] Provider Note    Event Date/Time   First MD Initiated Contact with Patient 03/15/24 2307     (approximate)   History   Shortness of Breath   HPI  Randall Hunter is a 63 y.o. male   Past medical history of neck/oropharyngeal cancer remotely now in remission not currently going under any sort of treatment, asthma, GERD, recently diagnosed pneumonia status post antibiotic therapy who presents the emergency department with worsening shortness of breath and chest tightness.  Earlier this week was seen by [name] as an outpatient diagnosed with pneumonia and just finished his antibiotics, his fevers have subsided but he continues to feel quite short winded.  He has chest tightness as well.  He has a mild cough ongoing.  His inhaler treatments have not been working very well.  External Medical Documents Reviewed: [name] outpatient notes from earlier this week documenting respiratory infectious symptoms and treatment      Physical Exam   Triage Vital Signs: ED Triage Vitals  Encounter Vitals Group     BP 03/15/24 2210 (!) 153/97     Systolic BP Percentile --      Diastolic BP Percentile --      Pulse Rate 03/15/24 2210 75     Resp 03/15/24 2210 (!) 24     Temp 03/15/24 2210 98 F (36.7 C)     Temp Source 03/15/24 2210 Oral     SpO2 03/15/24 2210 100 %     Weight 03/15/24 2211 214 lb 15.2 oz (97.5 kg)     Height --      Head Circumference --      Peak Flow --      Pain Score 03/15/24 2211 7     Pain Loc --      Pain Education --      Exclude from Growth Chart --     Most recent vital signs: Vitals:   03/15/24 2258 03/15/24 2307  BP:    Pulse: 72 71  Resp: 18 17  Temp:    SpO2: 94% 96%    General: Awake, no distress.  CV:  Good peripheral perfusion.  Resp:  Normal effort.  Abd:  No distention.  Other:  Has a wheeze, mild, no rales and no focality.  His skin appears warm well-perfused and is slightly hypertensive  otherwise vital signs are normal.  No increased work of breathing.  No hypoxemia.   ED Results / Procedures / Treatments   Labs (all labs ordered are listed, but only abnormal results are displayed) Labs Reviewed  BASIC METABOLIC PANEL WITH GFR - Abnormal; Notable for the following components:      Result Value   Glucose, Bld 108 (*)    All other components within normal limits  CBC - Abnormal; Notable for the following components:   WBC 11.2 (*)    All other components within normal limits  RESP PANEL BY RT-PCR (RSV, FLU A&B, COVID)  RVPGX2  BRAIN NATRIURETIC PEPTIDE  TROPONIN I (HIGH SENSITIVITY)     I ordered and reviewed the above labs they are notable for white blood cell count slightly elevated 11.2.  EKG  ED ECG REPORT I, Buell Carmin, the attending physician, personally viewed and interpreted this ECG.   Date: 03/15/2024  EKG Time: 2207  Rate: 75  Rhythm: sinus  Axis: nl  Intervals:nl  ST&T Change: no stemi    RADIOLOGY I independently reviewed and interpreted chest x-ray and  I see no obvious focality pneumothorax I also reviewed radiologist's formal read.   PROCEDURES:  Critical Care performed: {CriticalCareYesNo:19197::"Yes, see critical care procedure note(s)","No"}  Procedures   MEDICATIONS ORDERED IN ED: Medications  iohexol  (OMNIPAQUE ) 350 MG/ML injection 100 mL (has no administration in time range)  ipratropium-albuterol  (DUONEB) 0.5-2.5 (3) MG/3ML nebulizer solution 6 mL (6 mLs Nebulization Given 03/15/24 2337)  budesonide (PULMICORT) nebulizer solution 0.5 mg (0.5 mg Nebulization Given 03/15/24 2337)  methylPREDNISolone  sodium succinate (SOLU-MEDROL ) 125 mg/2 mL injection 125 mg (125 mg Intravenous Given 03/15/24 2337)    External physician / consultants:  I spoke with *** regarding care plan for this patient.   IMPRESSION / MDM / ASSESSMENT AND PLAN / ED COURSE  I reviewed the triage vital signs and the nursing notes.                                 Patient's presentation is most consistent with acute presentation with potential threat to life or bodily function.  Differential diagnosis includes, but is not limited to, asthma exacerbation, respiratory infection, pleurisy, PE or ACS   The patient is on the cardiac monitor to evaluate for evidence of arrhythmia and/or significant heart rate changes.  MDM:    Just getting over a respiratory infection completed antibiotics with ongoing shortness of breath and chest tightness, history of asthma, may be asthma exacerbation.  Will treat with DuoNebs, inhaled corticosteroid, IV Solu-Medrol  and reassess.  I also wonder if he may have a PE given his history of cancer albeit not active and not undergoing treatment, given his worsening shortness of breath despite adequate treatment with antibiotics and unresponsive to beta agonist therapy, will check with a CT angiogram of the chest.  Would be very atypical symptoms for ACS, EKG looks nonischemic, will check troponin.       FINAL CLINICAL IMPRESSION(S) / ED DIAGNOSES   Final diagnoses:  Mild intermittent asthma with exacerbation  SOB (shortness of breath)  Chest tightness     Rx / DC Orders   ED Discharge Orders     None        Note:  This document was prepared using Dragon voice recognition software and may include unintentional dictation errors.

## 2024-03-15 NOTE — ED Triage Notes (Signed)
 Pt in ambulatory with c/o sob, worsened since earlier in the week - was placed on Prednisone  and 2 abx by PCP on 4/28. Pt reports little improvement of symptoms, other than no longer having fevers. Increased sob with exertion, reports +np cough and chest soreness.

## 2024-03-16 LAB — TROPONIN I (HIGH SENSITIVITY): Troponin I (High Sensitivity): 3 ng/L (ref <18)

## 2024-03-16 LAB — BRAIN NATRIURETIC PEPTIDE: B Natriuretic Peptide: 55.3 pg/mL (ref 0.0–100.0)

## 2024-03-16 MED ORDER — ALBUTEROL SULFATE HFA 108 (90 BASE) MCG/ACT IN AERS: 2.0000 | INHALATION_SPRAY | Freq: Four times a day (QID) | RESPIRATORY_TRACT | 2 refills | Status: DC | PRN

## 2024-03-16 MED ORDER — AEROCHAMBER MV MISC: 0 refills | Status: DC

## 2024-03-16 NOTE — Discharge Instructions (Signed)
 Use inhaler as prescribed.  Continue taking prednisone  as prescribed by [name].  Call [name] office for follow-up appointment this week.  Thank you for choosing us  for your health care today!  Please see your primary doctor this week for a follow up appointment.   If you have any new, worsening, or unexpected symptoms call your doctor right away or come back to the emergency department for reevaluation.  It was my pleasure to care for you today.   Arron Large Margery Sheets, MD

## 2024-06-06 ENCOUNTER — Other Ambulatory Visit: Payer: Self-pay

## 2024-06-06 ENCOUNTER — Emergency Department

## 2024-06-06 ENCOUNTER — Emergency Department
Admission: EM | Admit: 2024-06-06 | Discharge: 2024-06-06 | Disposition: A | Discharge status: Home / Self Care | Source: Ambulatory Visit | Attending: Emergency Medicine | Admitting: Emergency Medicine

## 2024-06-06 DIAGNOSIS — Z8589 Personal history of malignant neoplasm of other organs and systems: Secondary | ICD-10-CM | POA: Diagnosis not present

## 2024-06-06 DIAGNOSIS — I700 Atherosclerosis of aorta: Secondary | ICD-10-CM | POA: Insufficient documentation

## 2024-06-06 DIAGNOSIS — M545 Low back pain, unspecified: Secondary | ICD-10-CM | POA: Diagnosis present

## 2024-06-06 DIAGNOSIS — I10 Essential (primary) hypertension: Secondary | ICD-10-CM | POA: Insufficient documentation

## 2024-06-06 DIAGNOSIS — M5441 Lumbago with sciatica, right side: Secondary | ICD-10-CM | POA: Insufficient documentation

## 2024-06-06 DIAGNOSIS — M5442 Lumbago with sciatica, left side: Secondary | ICD-10-CM | POA: Insufficient documentation

## 2024-06-06 DIAGNOSIS — R109 Unspecified abdominal pain: Secondary | ICD-10-CM | POA: Diagnosis not present

## 2024-06-06 LAB — CBC WITH DIFFERENTIAL/PLATELET
Abs Immature Granulocytes: 0.04 K/uL (ref 0.00–0.07)
Basophils Absolute: 0 K/uL (ref 0.0–0.1)
Basophils Relative: 0 %
Eosinophils Absolute: 0 K/uL (ref 0.0–0.5)
Eosinophils Relative: 1 %
HCT: 46 % (ref 39.0–52.0)
Hemoglobin: 15.1 g/dL (ref 13.0–17.0)
Immature Granulocytes: 1 %
Lymphocytes Relative: 16 %
Lymphs Abs: 1.4 K/uL (ref 0.7–4.0)
MCH: 30.8 pg (ref 26.0–34.0)
MCHC: 32.8 g/dL (ref 30.0–36.0)
MCV: 93.9 fL (ref 80.0–100.0)
Monocytes Absolute: 0.7 K/uL (ref 0.1–1.0)
Monocytes Relative: 7 %
Neutro Abs: 6.6 K/uL (ref 1.7–7.7)
Neutrophils Relative %: 75 %
Platelets: 255 K/uL (ref 150–400)
RBC: 4.9 MIL/uL (ref 4.22–5.81)
RDW: 15.4 % (ref 11.5–15.5)
WBC: 8.8 K/uL (ref 4.0–10.5)
nRBC: 0 % (ref 0.0–0.2)

## 2024-06-06 LAB — LIPASE, BLOOD: Lipase: 37 U/L (ref 11–51)

## 2024-06-06 LAB — COMPREHENSIVE METABOLIC PANEL WITH GFR
ALT: 15 U/L (ref 0–44)
AST: 20 U/L (ref 15–41)
Albumin: 4 g/dL (ref 3.5–5.0)
Alkaline Phosphatase: 69 U/L (ref 38–126)
Anion gap: 13 (ref 5–15)
BUN: 12 mg/dL (ref 8–23)
CO2: 26 mmol/L (ref 22–32)
Calcium: 9.2 mg/dL (ref 8.9–10.3)
Chloride: 104 mmol/L (ref 98–111)
Creatinine, Ser: 0.94 mg/dL (ref 0.61–1.24)
GFR, Estimated: >60 mL/min (ref >60)
Glucose, Bld: 104 mg/dL — ABNORMAL HIGH (ref 70–99)
Potassium: 4.9 mmol/L (ref 3.5–5.1)
Sodium: 143 mmol/L (ref 135–145)
Total Bilirubin: 1.1 mg/dL (ref 0.0–1.2)
Total Protein: 6.9 g/dL (ref 6.5–8.1)

## 2024-06-06 LAB — T4, FREE: Free T4: 0.84 ng/dL (ref 0.61–1.12)

## 2024-06-06 LAB — TSH: TSH: 4.442 u[IU]/mL (ref 0.350–4.500)

## 2024-06-06 MED ORDER — IOHEXOL 300 MG/ML  SOLN
100.0000 mL | Freq: Once | INTRAMUSCULAR | Status: AC | PRN
  Administered 2024-06-06: 100 mL via INTRAVENOUS

## 2024-06-06 MED ORDER — SODIUM CHLORIDE 0.9 % IV BOLUS
1000.0000 mL | Freq: Once | INTRAVENOUS | Status: AC
  Administered 2024-06-06: 1000 mL via INTRAVENOUS

## 2024-06-06 MED ORDER — METHYLPREDNISOLONE 4 MG PO TBPK: ORAL_TABLET | ORAL | 0 refills | Status: DC

## 2024-06-06 MED ORDER — MORPHINE SULFATE (PF) 4 MG/ML IV SOLN
4.0000 mg | Freq: Once | INTRAVENOUS | Status: AC
  Administered 2024-06-06: 4 mg via INTRAVENOUS
  Filled 2024-06-06: qty 1

## 2024-06-06 MED ORDER — OXYCODONE-ACETAMINOPHEN 5-325 MG PO TABS: 1.0000 | ORAL_TABLET | Freq: Four times a day (QID) | ORAL | 0 refills | Status: AC | PRN

## 2024-06-06 MED ORDER — HYDROMORPHONE HCL 1 MG/ML IJ SOLN
1.0000 mg | Freq: Once | INTRAMUSCULAR | Status: AC
  Administered 2024-06-06: 1 mg via INTRAVENOUS
  Filled 2024-06-06: qty 1

## 2024-06-06 NOTE — ED Triage Notes (Signed)
 C?O lower back pain since Wednesday, worsening last night.  Sent to ED from Dr. Dianne office for evaluation with concern for epidural abscess.  Patient states pain worsens when standing up.  AAOx3.  Skin warm and dry. NAD

## 2024-06-06 NOTE — ED Provider Notes (Signed)
 [facility] Provider Note    Event Date/Time   First MD Initiated Contact with Patient 06/06/24 1217     (approximate)   History   Chief Complaint: Back Pain   HPI  Randall Hunter is a 63 y.o. male with a history of hypertension GERD neck cancer status post resection and radiation in 2014 who is sent to the ED from his PCP due to low back pain and suspected lower extremity weakness.  Patient reports pain has been ongoing for the last 2 days, gradual onset and worsening and now severe.  Radiates down bilateral legs.  No chest pain shortness of breath or fever.  No trauma.  No chills.  Endorses night sweats.  PCP noted weight loss as well.        Past Medical History:  Diagnosis Date   Depression    Difficult intubation    Very anterior and cephalad glottis.SABRAsuccessful with macgraph and styletted ett.   GERD (gastroesophageal reflux disease)    Hypertension    Malignant tumor of neck ([facility])    left sided cervical SCC s\p resection\radion, 2014 revision surgery    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[facility])    lef sided oropharyngeal SCC s\p resection\radiation   Pneumonia     Current Outpatient Rx   Order #: [id number] Class: Historical Med   Order #: [id number] Class: Normal   Order #: [id number] Class: Historical Med   Order #: [id number] Class: Historical Med   Order #: [id number] Class: Historical Med   Order #: [id number] Class: Historical Med   Order #: [id number] Class: Historical Med   Order #: [id number] Class: Historical Med   Order #: [id number] Class: Historical Med   Order #: [id number] Class: Historical Med   Order #: [id number] Class: Print   Order #: [id number] Class: Historical Med   Order #: [id number] Class: Historical Med   Order #: [id number] Class: Print   Order #: [id number] Class: Normal    Past Surgical History:  Procedure Laterality Date   ADENOIDECTOMY     APPENDECTOMY     CERVICAL DISC ARTHROPLASTY Right 01/09/2018   Procedure: CERVICAL ANTERIOR [place]  ARTHROPLASTY-C6-7;  Surgeon: Clois Fret, MD;  Location: [facility] ORS;  Service: Neurosurgery;  Laterality: Right;   RADICAL NECK DISSECTION  2002 and 2014   left cervical neck   TONSILLECTOMY      Physical Exam   Triage Vital Signs: ED Triage Vitals  Encounter Vitals Group     BP 06/06/24 1210 (!) 140/120     Girls Systolic BP Percentile --      Girls Diastolic BP Percentile --      Boys Systolic BP Percentile --      Boys Diastolic BP Percentile --      Pulse Rate 06/06/24 1210 100     Resp 06/06/24 1210 16     Temp 06/06/24 1210 98.1 F (36.7 C)     Temp Source 06/06/24 1210 Oral     SpO2 06/06/24 1210 100 %     Weight 06/06/24 1211 214 lb 15.2 oz (97.5 kg)     Height --      Head Circumference --      Peak Flow --      Pain Score 06/06/24 1211 8     Pain Loc --      Pain Education --      Exclude from Growth Chart --     Most recent vital signs: Vitals:   06/06/24 1210  BP: (!) 140/120  Pulse: 100  Resp: 16  Temp: 98.1 F (  36.7 C)  SpO2: 100%    General: Awake, no distress.  CV:  Good peripheral perfusion.  Regular rate rhythm Resp:  Normal effort.  Clear to auscultation Abd:  No distention.  Soft with mild generalized tenderness Other:  Midline tenderness over the lumbar spine.  There is also muscular tenderness in the right lower back paraspinous musculature.  Able to hold both legs individually off the bed against gravity.  Normal patellar reflexes bilaterally.   ED Results / Procedures / Treatments   Labs (all labs ordered are listed, but only abnormal results are displayed) Labs Reviewed  COMPREHENSIVE METABOLIC PANEL WITH GFR - Abnormal; Notable for the following components:      Result Value   Glucose, Bld 104 (*)    All other components within normal limits  CBC WITH DIFFERENTIAL/PLATELET  TSH  T4, FREE  LIPASE, BLOOD  URINALYSIS, W/ REFLEX TO CULTURE (INFECTION SUSPECTED)     EKG    RADIOLOGY CT abdomen pelvis interpreted by me,  no signs of obstruction or free air.  No obvious L-spine lesions.  Radiology report reviewed   PROCEDURES:  Procedures   MEDICATIONS ORDERED IN ED: Medications  sodium chloride  0.9 % bolus 1,000 mL (0 mLs Intravenous Stopped 06/06/24 1356)  HYDROmorphone  (DILAUDID ) injection 1 mg (1 mg Intravenous Given 06/06/24 1240)  iohexol  (OMNIPAQUE ) 300 MG/ML solution 100 mL (100 mLs Intravenous Contrast Given 06/06/24 1330)  morphine (PF) 4 MG/ML injection 4 mg (4 mg Intravenous Given 06/06/24 1428)     IMPRESSION / MDM / ASSESSMENT AND PLAN / ED COURSE  I reviewed the triage vital signs and the nursing notes.  DDx: Lumbar herniated disc, lumbar spinal metastases, abdominal mass/metastases, electrolyte derangement, pancreatitis  Patient's presentation is most consistent with acute presentation with potential threat to life or bodily function.  Patient with remote cancer history comes ED complaining of low back pain.  On exam, strength is grossly normal, patellar reflexes are normal.  He does have tenderness in the low back along the musculature as well as on the spinous processes.  This is suspicious to me of a musculoskeletal issue.  However, with his past medical history, worrisome for recurrent/metastatic cancer.  See CT abdomen pelvis obtained which is unremarkable.  Will proceed to MRI L-spine.  Patient given Dilaudid  and morphine for pain relief.       FINAL CLINICAL IMPRESSION(S) / ED DIAGNOSES   Final diagnoses:  Acute midline low back pain with bilateral sciatica     Rx / DC Orders   ED Discharge Orders     None        Note:  This document was prepared using Dragon voice recognition software and may include unintentional dictation errors.   [name], MD 06/06/24 [phone]

## 2024-06-06 NOTE — Progress Notes (Signed)
 Patient Profile:   Randall Hunter  is a 63 y.o.  male Chief Complaint  Patient presents with  . Back Pain  . no appetite    Has lost 15 lbs since last visit on 6/5      PROBLEM LIST: Past Medical History:  Diagnosis Date  . Anemia   . Depression   . GERD (gastroesophageal reflux disease)   . Hyperlipidemia   . Hypertension   . 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 Tremor     Past Surgical History:  Procedure Laterality Date  . RADICIAL NECK DISSECTION  2002  . TONSILLECTOMY AND ADENOIDECTOMY  2002   [place]  . C6-7 artificial disc  01/09/2018   Dr Reeves Daisy - [facility]  . APPENDECTOMY    . OTHER SURGERY     Esophageal dilation 11/2023 pe patient.    ALLERGIES: No Active Allergies  CURRENT MEDICATIONS: Current Outpatient Medications  Medication Sig Dispense Refill  . albuterol  MDI, PROVENTIL , VENTOLIN , PROAIR , HFA 90 mcg/actuation inhaler Inhale 2 Inhalations into the lungs every 6 (six) hours as needed    . ALPRAZolam (XANAX) 0.5 MG tablet Take 1 tablet (0.5 mg total) by mouth at bedtime as needed 30 tablet 5  . amLODIPine  (NORVASC ) 5 MG tablet Take 1 tablet (5 mg total) by mouth once daily 90 tablet 3  . aspirin 325 MG tablet Take 325 mg by mouth once daily    . atorvastatin  (LIPITOR) 20 MG tablet Take 1 tablet (20 mg total) by mouth once daily 90 tablet 3  . buPROPion  (WELLBUTRIN  XL) 150 MG XL tablet Take 1 tablet (150 mg total) by mouth once daily 90 tablet 3  . cyanocobalamin (VITAMIN B12) 1000 MCG tablet Take 1,000 mcg by mouth once a week 500 or 1000 mcg once a week    . famotidine (PEPCID) 40 MG tablet Take 40 mg by mouth at bedtime    . levothyroxine (SYNTHROID) 25 MCG tablet Take 1 tablet (25 mcg total) by mouth every morning before breakfast (0630) 90 tablet 3  . olmesartan (BENICAR) 20 MG tablet Take 0.5 tablets (10 mg total) by mouth once daily 45 tablet 3  . pantoprazole  (PROTONIX ) 40  MG DR tablet Take 1 tablet (40 mg total) by mouth once daily 90 tablet 3  . PARoxetine  (PAXIL ) 20 MG tablet Take 1 tablet (20 mg total) by mouth every morning 90 tablet 3   No current facility-administered medications for this visit.      HPI   CLINICAL SUMMARY:  Patient reports since Wednesday the immediate onset of severe upper lumbar vertebral pain with radiation into bilateral groin now with weakness of his legs, really cannot stand up.  Fevers, chills, progressive generalized abdominal pain.  He has lost 15 pounds over the last couple weeks  ROS: Review of systems is unremarkable for any active cardiac, respiratory, GI, GU, hematologic, neurologic, dermatologic, HEENT, or psychiatric symptoms except as noted above, 10 systems reviewed.  No fevers, chills, or constitutional symptoms.   PHYSICAL EXAM  Vital signs:  BP 120/80   Pulse 99   Ht 188 cm (6' 2)   Wt 86.6 kg (191 lb)   SpO2 99%   BMI 24.52 kg/m  Body mass index is 24.52 kg/m.   Wt Readings from Last 3 Encounters:  06/06/24 86.6 kg (191 lb)  05/28/24 89.8 kg (198 lb)  05/06/24 90.5 kg (199 lb 9.6 oz)     BP Readings from Last 3 Encounters:  06/06/24 120/80  05/28/24 116/70  05/06/24 130/80    Constitutional no acute distress Neck: supple, no thyromegaly, good ROM Respiratory:clear to auscultation, no rales or wheezes Cardiovascular:RRR, no murmur or gallop Abdominal:soft, reduced bowel sounds, severe generalized tenderness Ext: Focal vertebral tenderness Neuro: alert and oriented X 3, 3/5 bilateral lower extremity strength     ASSESSMENT/PLAN   Acute lumbar myelopathy-very concerning for infectious etiology, possible epidural abscess, likely neurosurgical emergency A great concern is his weight loss and associated abdominal pain, certainly will need CT abdomen as well  Immediate referral to the emergency room  Dispo:   No follow-ups on file.

## 2024-06-06 NOTE — Discharge Instructions (Addendum)
 Please attend a follow-up appointment with Dr. Clois on Monday, 7/28 at 3pm. I sent a prescription for a steroid taper to your pharmacy.  Please take this as directed.  I have also sent a short course of narcotic pain medicine for breakthrough pain.  This can make you drowsy, do not drive or operate machinery when taking this.  Return to the ER immediately if you develop new or worsening symptoms including worsening weakness in your legs, new numbness, tingling, focal weakness, inability to void or incontinence, or any other new or concerning symptoms.

## 2024-06-06 NOTE — ED Provider Notes (Signed)
 Care of this patient assumed from prior physician at 1500 pending MRI and disposition. Please see prior physician note for further details.  Briefly this is a 63 year old male with acute onset of back pain a few days ago sent in from primary care doctor with concerns for acute spinal cord pathology.  Stable vital signs on presentation.  Labs overall reassuring. CT abdomen pelvis without acute findings.  MRI lumbar spine with degenerative disease.  Signed out to me pending completion of MRI lumbar spine without contrast and disposition.  MRI resulted with lumbar spondylosis with area of canal narrowing the thecal sac at L3-L4.  Patient denies any bowel or bladder symptoms.  On my exam he has improved pain after receiving pain medication here.  Minimal weakness on my exam bilaterally, 4+/5 strength with hip flexion bilaterally.  Case was reviewed with Dr. Katrina with neurosurgery.  He notes that patient's MRI findings are consistent with degenerative changes.  He has low suspicion for primary infectious process based on MRI results which he reviewed and does not think patient needs dedicated contrasted study here in the ER.  He does think patient can be discharged from the ER with close outpatient follow-up, arranged for 3 PM on Monday with Dr. Clois.  He does note that if patient has any worsening weakness he does need to present back to the ER.  He does recommend discharge with Medrol  Dosepak.  I discussed this plan with the patient and he is comfortable with it.  He is able to ambulate.  Will also DC with short course of pain medication.  Strict return precautions provided.  Patient discharged in stable condition.   Levander Slate, MD 06/06/24 [phone]

## 2024-06-08 ENCOUNTER — Emergency Department
Admission: EM | Admit: 2024-06-08 | Discharge: 2024-06-08 | Disposition: A | Discharge status: Home / Self Care | Attending: Emergency Medicine | Admitting: Emergency Medicine

## 2024-06-08 ENCOUNTER — Other Ambulatory Visit: Payer: Self-pay

## 2024-06-08 DIAGNOSIS — R300 Dysuria: Secondary | ICD-10-CM | POA: Diagnosis present

## 2024-06-08 DIAGNOSIS — T402X5A Adverse effect of other opioids, initial encounter: Secondary | ICD-10-CM | POA: Diagnosis not present

## 2024-06-08 DIAGNOSIS — T50905A Adverse effect of unspecified drugs, medicaments and biological substances, initial encounter: Secondary | ICD-10-CM

## 2024-06-08 LAB — URINALYSIS, ROUTINE W REFLEX MICROSCOPIC
Bilirubin Urine: NEGATIVE
Glucose, UA: NEGATIVE mg/dL
Hgb urine dipstick: NEGATIVE
Ketones, ur: NEGATIVE mg/dL
Leukocytes,Ua: NEGATIVE
Nitrite: NEGATIVE
Protein, ur: NEGATIVE mg/dL
Specific Gravity, Urine: 1.01 (ref 1.005–1.030)
pH: 5 (ref 5.0–8.0)

## 2024-06-08 NOTE — Discharge Instructions (Signed)
 I suspect the difficulty urinating is related to the pain medication that you are on, your urinalysis was quite reassuring, be sure to follow-up with Dr. Katrina tomorrow

## 2024-06-08 NOTE — ED Provider Notes (Signed)
 [facility] Provider Note    Event Date/Time   First MD Initiated Contact with Patient 06/08/24 1448     (approximate)   History   Difficulty urinate  HPI  Randall Hunter is a 63 y.o. male with a history of hypertension, oropharyngeal carcinoma who was seen for back pain here in the emergency department 2 days ago.  At that time he had an MRI which was overall reassuring.  He has follow-up with neurosurgery tomorrow for continued back pain.  He reports he has had to strain to urinate today but denies dysuria.  No hematuria.  No incontinence, no new weakness or numbness     Physical Exam   Triage Vital Signs: ED Triage Vitals  Encounter Vitals Group     BP 06/08/24 1435 (!) 136/104     Girls Systolic BP Percentile --      Girls Diastolic BP Percentile --      Boys Systolic BP Percentile --      Boys Diastolic BP Percentile --      Pulse Rate 06/08/24 1435 98     Resp 06/08/24 1435 18     Temp 06/08/24 1435 98.2 F (36.8 C)     Temp Source 06/08/24 1435 Oral     SpO2 06/08/24 1435 100 %     Weight 06/08/24 1436 86.6 kg (191 lb)     Height 06/08/24 1436 1.88 m (6' 2)     Head Circumference --      Peak Flow --      Pain Score 06/08/24 1439 8     Pain Loc --      Pain Education --      Exclude from Growth Chart --     Most recent vital signs: Vitals:   06/08/24 1435 06/08/24 1600  BP: (!) 136/104 (!) 140/90  Pulse: 98 75  Resp: 18 16  Temp: 98.2 F (36.8 C) 98 F (36.7 C)  SpO2: 100% 99%     General: Awake, no distress.  CV:  Good peripheral perfusion.  Resp:  Normal effort.  Abd:  No distention.  Other:  Normal strength in the lower extremities, ambulating well without difficulty, no saddle anesthesia   ED Results / Procedures / Treatments   Labs (all labs ordered are listed, but only abnormal results are displayed) Labs Reviewed  URINALYSIS, ROUTINE W REFLEX MICROSCOPIC - Abnormal; Notable for the following components:       Result Value   Color, Urine YELLOW (*)    APPearance HAZY (*)    All other components within normal limits     EKG     RADIOLOGY     PROCEDURES:  Critical Care performed:   Procedures   MEDICATIONS ORDERED IN ED: Medications - No data to display   IMPRESSION / MDM / ASSESSMENT AND PLAN / ED COURSE  I reviewed the triage vital signs and the nursing notes. Patient's presentation is most consistent with acute complicated illness / injury requiring diagnostic workup.  Patient presents with sensation of having to strain to urinate.  I suspect this is related to the oxycodone  that he was sent home with 2 days ago.  Possibly related to prednisone  but more likely oxycodone .  Will check urinalysis to rule out urinary tract infection  MRI from 2 days ago was overall reassuring, no incontinence, no new neurodeficits  Urinalysis is reassuring, he is reassured by this as well, he will follow-up with neurosurgery tomorrow.  FINAL CLINICAL IMPRESSION(S) / ED DIAGNOSES   Final diagnoses:  Medication side effect, initial encounter     Rx / DC Orders   ED Discharge Orders     None        Note:  This document was prepared using Dragon voice recognition software and may include unintentional dictation errors.   Arlander Charleston, MD 06/08/24 [phone]

## 2024-06-08 NOTE — ED Triage Notes (Signed)
 Pt to ED for lower back pain with bulging discs, seen here yesterday. Has been feeling constant feeling of bladder fullness since last night. States hard to start urinary stream (takes like 5 minutes) and having frequency. Last void was 1 hour ago, normal. Also having pain midline to testicles.

## 2024-06-09 ENCOUNTER — Encounter: Payer: Self-pay | Admitting: Neurosurgery

## 2024-06-09 ENCOUNTER — Ambulatory Visit (INDEPENDENT_AMBULATORY_CARE_PROVIDER_SITE_OTHER): Admitting: Neurosurgery

## 2024-06-09 VITALS — BP 146/102 | Ht 74.0 in | Wt 191.0 lb

## 2024-06-09 DIAGNOSIS — M48061 Spinal stenosis, lumbar region without neurogenic claudication: Secondary | ICD-10-CM

## 2024-06-09 DIAGNOSIS — M5441 Lumbago with sciatica, right side: Secondary | ICD-10-CM

## 2024-06-09 DIAGNOSIS — M7138 Other bursal cyst, other site: Secondary | ICD-10-CM | POA: Diagnosis not present

## 2024-06-09 DIAGNOSIS — M5442 Lumbago with sciatica, left side: Secondary | ICD-10-CM | POA: Diagnosis not present

## 2024-06-09 MED ORDER — CYCLOBENZAPRINE HCL 10 MG PO TABS: 10.0000 mg | ORAL_TABLET | Freq: Three times a day (TID) | ORAL | 0 refills | Status: DC | PRN

## 2024-06-09 NOTE — Addendum Note (Signed)
 Addended by: CLOIS FRET on: 06/09/2024 03:26 PM   Modules accepted: Orders

## 2024-06-09 NOTE — Progress Notes (Signed)
 Referring Physician:  Cleotilde Oneil FALCON, MD [address] [facility] [place],  [place] [address]  Primary Physician:  Randall Oneil FALCON, MD  History of Present Illness: 06/09/2024 [name] is here today with a chief complaint of back pain that began last week.  It is worse when he changes positions.  He has had trouble getting comfortable particularly when he sits or lays down or has to change positions.  The pain is in his low back on both sides.  He has never had an episode like this before.  Over the weekend, he began having some discomfort into his groin, testicles, and anterior thighs as well.  He started a steroid taper which may have helped dull his pain from a 9 to an 8.  Bowel/Bladder Dysfunction: none  Conservative measures:  Physical therapy:  has not participated in recently Multimodal medical therapy including regular antiinflammatories:  Medrol  dose Pak, Percocet, Tylenol  Injections:  12/24/2017-ESI right side C7-T1   Past Surgery:  RADICIAL NECK DISSECTION 2002 and 2014 C6-7 artificial disc 01/09/2018   Randall Hunter has no symptoms of cervical myelopathy.  The symptoms are causing a significant impact on the patient's life.   I have utilized the care everywhere function in epic to review the outside records available from external health systems.  Review of Systems:  A 10 point review of systems is negative, except for the pertinent positives and negatives detailed in the HPI.  Past Medical History: Past Medical History:  Diagnosis Date   Depression    Difficult intubation    Very anterior and cephalad glottis.SABRAsuccessful with macgraph and styletted ett.   GERD (gastroesophageal reflux disease)    Hypertension    Malignant tumor of neck (HCC)    left sided cervical SCC s\p resection\radion, 2014 revision surgery    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HCC)    lef sided oropharyngeal SCC s\p resection\radiation   Pneumonia      Past Surgical History: Past Surgical History:  Procedure Laterality Date   ADENOIDECTOMY     APPENDECTOMY     CERVICAL DISC ARTHROPLASTY Right 01/09/2018   Procedure: CERVICAL ANTERIOR DISC ARTHROPLASTY-C6-7;  Surgeon: Clois Fret, MD;  Location: [facility] ORS;  Service: Neurosurgery;  Laterality: Right;   RADICAL NECK DISSECTION  2002 and 2014   left cervical neck   TONSILLECTOMY      Allergies: Allergies as of 06/09/2024   (No Known Allergies)    Medications:  Current Outpatient Medications:    acetaminophen  (TYLENOL ) 500 MG tablet, Take 500 mg by mouth daily as needed for moderate pain or headache., Disp: , Rfl:    albuterol  (VENTOLIN  HFA) 108 (90 Base) MCG/ACT inhaler, Inhale 2 puffs into the lungs every 6 (six) hours as needed for wheezing or shortness of breath., Disp: 51 each, Rfl: 2   amLODipine  (NORVASC ) 10 MG tablet, Take 10 mg by mouth daily., Disp: , Rfl:    aspirin 81 MG chewable tablet, Chew 81 mg by mouth daily., Disp: , Rfl:    atorvastatin  (LIPITOR) 20 MG tablet, Take 20 mg by mouth every evening., Disp: , Rfl:    buPROPion  (WELLBUTRIN  XL) 150 MG 24 hr tablet, Take 150 mg by mouth daily., Disp: , Rfl:    Cyanocobalamin (B-12) 2500 MCG SUBL, Place 2,500 mcg under the tongue every Sunday., Disp: , Rfl:    famotidine (PEPCID) 40 MG tablet, Take 40 mg by mouth., Disp: , Rfl:    ibuprofen (ADVIL,MOTRIN) 200 MG tablet, Take  600 mg by mouth daily as needed for headache., Disp: , Rfl:    loratadine  (CLARITIN ) 10 MG tablet, Take 10 mg by mouth daily as needed for allergies., Disp: , Rfl:    methylPREDNISolone  (MEDROL  DOSEPAK) 4 MG TBPK tablet, Day 1:  (2) tablets before breakfast, (1) after lunch, (1) after dinner, (2) at bedtime [24-mg] Day 2:  (1) before breakfast, (1) after lunch, (1) after dinner, (2) at bedtime [20-mg] Day 3:  (1) before breakfast, (1) after lunch, (1) after dinner, (1) at bedtime [16-mg] Day 4:  (1) before breakfast, (1) after lunch, (1) at bedtime  [12-mg] Day 5:  (1) before breakfast, (1) at bedtime [8-mg] Day 6:  (1) before breakfast [4-mg], Disp: 21 tablet, Rfl: 0   olmesartan (BENICAR) 20 MG tablet, Take 20 mg by mouth daily., Disp: , Rfl:    oxyCODONE -acetaminophen  (PERCOCET) 5-325 MG tablet, Take 1 tablet by mouth every 8 (eight) hours as needed., Disp: 20 tablet, Rfl: 0   pantoprazole  (PROTONIX ) 40 MG tablet, Take 40 mg by mouth every evening. , Disp: , Rfl:    PARoxetine  (PAXIL ) 20 MG tablet, Take 20 mg by mouth daily., Disp: , Rfl:    predniSONE  (STERAPRED UNI-PAK 21 TAB) 10 MG (21) TBPK tablet, Dispense steroid taper pack as directed, Disp: 21 tablet, Rfl: 0   Spacer/Aero-Holding Chambers (AEROCHAMBER MV) inhaler, Use as instructed, Disp: 3 each, Rfl: 0   oxyCODONE -acetaminophen  (PERCOCET) 5-325 MG tablet, Take 1 tablet by mouth every 6 (six) hours as needed for up to 5 days for severe pain (pain score 7-10)., Disp: 10 tablet, Rfl: 0  Social History: Social History   Tobacco Use   Smoking status: Never   Smokeless tobacco: Never  Vaping Use   Vaping status: Never Used  Substance Use Topics   Alcohol use: Yes    Alcohol/week: 3.0 standard drinks of alcohol    Types: 3 Cans of beer per week   Drug use: No    Family Medical History: History reviewed. No pertinent family history.  Physical Examination: Vitals:   06/09/24 1459  BP: (!) 146/102    General: Patient is in no apparent distress. Attention to examination is appropriate.  Neck:   Supple.  Full range of motion.  Respiratory: Patient is breathing without any difficulty.   NEUROLOGICAL:     Awake, alert, oriented to person, place, and time.  Speech is raspy but clear and fluent.  He has a known VC paralysis.  Cranial Nerves: Pupils equal round and reactive to light.  Facial tone is symmetric.  Facial sensation is symmetric. Shoulder shrug is symmetric. Tongue protrusion is midline.   Strength: UE 5/5  Side Iliopsoas Quads Hamstring PF DF EHL  R 5 5 5  5 5 5   L 4+ 4+ 5 5 5 5    Reflexes are 1+ and symmetric at the biceps, triceps, brachioradialis, patella and achilles.   Hoffman's is absent.   Bilateral upper and lower extremity sensation is intact to light touch.    No evidence of dysmetria noted.  Gait is normal.  He has difficulty changing positions   Medical Decision Making  Imaging: MRI L spine 06/06/2024 Disc levels:   T12-L1: No impingement. Small left paracentral disc protrusion and left foraminal annular tear.   L1-2: Unremarkable   L2-3: No impingement. Mild disc bulge and mild degenerative facet arthropathy.   L3-4: Moderate to prominent central narrowing of the thecal sac with borderline left foraminal stenosis due to degenerative facet arthropathy,  ligamentum flavum redundancy, small synovial cyst in the left ligamentum flavum, and diffuse disc bulge.   L4-5: No impingement. Diffuse disc bulge and right greater than left facet arthropathy. Mild central annular tearing.   L5-S1: No impingement. Mild disc osteophyte complex. Minimal left lateral recess annular tear.   IMPRESSION: 1. Lumbar spondylosis and degenerative disc disease, causing moderate to prominent central narrowing of the thecal sac at L3-4 and borderline left foraminal stenosis at L3-4. 2. Degenerative facet edema bilaterally at L3-4 and L4-5. 3. 4 mm degenerative retrolisthesis at L2-3.     Electronically Signed   By: Ryan Salvage M.D.   On: 06/06/2024 15:42  I have personally reviewed the images and agree with the above interpretation.  Assessment and Plan: Randall Hunter is a pleasant 63 y.o. male with lumbar spine findings most significant at L3-4.  He has at least moderate stenosis at that level.  He also has a synovial cyst at L3-4 on the left.  I think this is his most likely symptomatic level.   He is starting to see some benefit from a steroid taper.  I would like to get him in for an urgent injection.  If he improves with  that, we will continue with conservative management.  If he is stable from that, we will consider initiating physical therapy.  If he worsens, we will consider L3-4 decompression.  I have asked him to let me know when his injection is scheduled so that we can arrange follow-up.  I spent a total of 20 minutes in this patient's care today. This time was spent reviewing pertinent records including imaging studies, obtaining and confirming history, performing a directed evaluation, formulating and discussing my recommendations, and documenting the visit within the medical record.     Thank you for involving me in the care of this patient.      Marsa Matteo K. Clois MD, [facility] Neurosurgery

## 2024-06-11 ENCOUNTER — Encounter: Payer: Self-pay | Admitting: Neurosurgery

## 2024-06-11 NOTE — Progress Notes (Signed)
 [facility] - Physiatry  PROCEDURE: 1. Bilateral L4-5 transforaminal epidural injections under fluoroscopic guidance. 2. Use of fluoroscopic guidance was required to ensure adequate delivery of medication into the epidural space and for proper needle placement. 3. The patient was monitored with continuous pulse oximetry throughout the procedures.   DIAGNOSIS/INDICATION FOR PROCEDURE: The patient is a 63 year old gentleman followed by Dr. Clois for acute on chronic low back pain with radiation into the bilateral buttock, groin, testicles, and anterior thighs.  Clinically has symptoms consistent with a lumbosacral radiculitis.  He was worked in for the procedure due to his pain.   He is status post C6-7 artificial disc replacement on 01/09/2018.  He has not had lumbar spine surgery.  MRI of the lumbar spine without contrast from [facility] dated 06/06/2024 both images and report were reviewed. MRI LUMBAR SPINE WITHOUT CONTRAST  TECHNIQUE:  Multiplanar, multisequence MR imaging of the lumbar spine was  performed. No intravenous contrast was administered.  COMPARISON:  08/29/2019 lumbar spine CT from 06/06/2024  FINDINGS:  Despite efforts by the technologist and patient, motion artifact is  present on today's exam and could not be eliminated. This reduces  exam sensitivity and specificity.  Segmentation: The lowest lumbar type non-rib-bearing vertebra is  labeled as L5.  Alignment: 4 mm degenerative retrolisthesis at L2-3, unchanged from  prior.  Vertebrae: Disc desiccation at L2-3, L4-5, and L5-S1 with associated  loss of intervertebral disc height.  Degenerative facet edema bilaterally at L3-4 and L4-5.  Conus medullaris and cauda equina: Conus extends to the L1-2 level.  Conus and cauda equina appear normal.  Paraspinal and other soft tissues: Unremarkable   Disc levels:  T12-L1: No impingement. Small left paracentral disc protrusion and   left foraminal annular tear.   L1-2: Unremarkable   L2-3: No impingement. Mild disc bulge and mild degenerative facet  arthropathy.   L3-4: Moderate to prominent central narrowing of the thecal sac with  borderline left foraminal stenosis due to degenerative facet  arthropathy, ligamentum flavum redundancy, small synovial cyst in  the left ligamentum flavum, and diffuse disc bulge.   L4-5: No impingement. Diffuse disc bulge and right greater than left  facet arthropathy. Mild central annular tearing.   L5-S1: No impingement. Mild disc osteophyte complex. Minimal left  lateral recess annular tear.   IMPRESSION:  1. Lumbar spondylosis and degenerative disc disease, causing  moderate to prominent central narrowing of the thecal sac at L3-4  and borderline left foraminal stenosis at L3-4.  2. Degenerative facet edema bilaterally at L3-4 and L4-5.  3. 4 mm degenerative retrolisthesis at L2-3.   Electronically Signed    By: Ryan Salvage M.D.    On: 06/06/2024 15:42   His pain is rated 5/10.  His pain reaches 9/10.   Procedures: 06/11/2024: Bilateral L4-5 transforaminal ESI   IMPRESSION/RESULTS: Patient tolerated the procedure well without any complications. We used a 3.5 inch needle and 2% lidocaine .  Prior to the procedure he rated his pain 5/10.  Immediately after the procedure he rated his pain 4/10.  After sitting in the lobby for 10 minutes after the procedure, he began to develop Left lateral thigh pain with standing and hip flexion.  His symptoms would improve with sitting.  He was monitored for an additional 30 minutes and had some improvement of these symptoms.  He will follow up with with Dr. Clois in 3 weeks.   INFORMED CONSENT: The patient understood the potential risks and benefits  of the procedure, which were explained to the patient prior to the procedure.  The patient read and signed the consent stating complete understanding of the information, and  wished to proceed with the procedure, there were no barriers to understanding.  Ample time was given for any questions to be answered prior to the procedure. The risks of the procedure were explained including, but not limited to the risk of bleeding and/or infection into the spinal area, intervertebral discs, or epidural space; nerve injury; nerve irritation; reaction to medications; etc.  The patient denied any history of bleeding disorders, allergies to medications used or other medical contraindications to the procedure.  No promises were given to any expected outcome.     PROCEDURE: A time out was performed by physician and assistant.  Correct patient, procedure, plan of care, site, position, and equipment were verified.  Identical procedural technique was used for both epidural injections.  The patient was sterilely prepped with a triple scrub of betadine solution and draped in the prone position.  Careful attention was paid to aseptic technique throughout the procedure.  Then, the Right L4-5 neuroforamen was identified under fluoroscopic guidance.  The overlying skin and subcutaneous tissues were anesthetized with approximately 2 cc of 1% Xylocaine .  A 25 gauge spinal needle was inserted down into the foramen.  Approximately 2 cc of Omnipaque -240 mgI/mL contrast was infiltrated under real time-fluoroscopy demonstrating satisfactory spread along the exiting spinal nerve and into the epidural space without vascular uptake.  No aspirate was noted.  Spot films were taken.  A solution containing 0.5 cc of dexamethasone  sodium phosphate  10 mg/cc and 0.5 cc of 2% preservative-free lidocaine  was slowly infiltrated around the spinal nerve and into the epidural space under real-time fluoroscopy.  There was good contrast washout.  The needle was removed.   Then, the Left L4-5 neuroforamen was identified under fluoroscopic guidance.  The overlying skin and subcutaneous tissues were anesthetized with approximately 2  cc of 1% Xylocaine .  A 25 gauge spinal needle was inserted down into the foramen.  Approximately 2 cc of Omnipaque -240 mgI/mL contrast was infiltrated under real time-fluoroscopy demonstrating satisfactory spread along the exiting spinal nerve and into the epidural space without vascular uptake.  No aspirate was noted.  Spot films were taken.  A solution containing 0.5 cc of dexamethasone  sodium phosphate  10 mg/cc and 0.5 cc of 2% preservative-free lidocaine  was slowly infiltrated around the spinal nerve and into the epidural space under real-time fluoroscopy.  There was good contrast washout.  The needle was removed.  There were no complications.   DISCHARGE SUMMARY: The patient was monitored post-injection for 30 minutes in the recovery area and remained stable without evidence of complications.  The patient was discharged with discharge instructions in stable condition. Patient was instructed to contact us  with any problems.  Procedure fluoro time: 19 seconds. Fluoroscopy dose: 10.92 mGy.  This is below the dose threshold #1 (5000 mGy).    Randall CHARLES CHASNIS, DO

## 2024-06-16 ENCOUNTER — Telehealth: Payer: Self-pay | Admitting: Neurosurgery

## 2024-06-16 DIAGNOSIS — M5441 Lumbago with sciatica, right side: Secondary | ICD-10-CM

## 2024-06-16 DIAGNOSIS — M48061 Spinal stenosis, lumbar region without neurogenic claudication: Secondary | ICD-10-CM

## 2024-06-16 NOTE — Telephone Encounter (Signed)
Patient advised and referral placed.  

## 2024-06-16 NOTE — Telephone Encounter (Signed)
 Patient called in and stated that the epidural lasted only about 2 days, and the pain has came back. He is wondering what are the next steps and what he needs to do from here.

## 2024-06-18 NOTE — Progress Notes (Signed)
 Patient Profile:   Randall Hunter  is a 63 y.o.  male Chief Complaint  Patient presents with  . Back Pain    Pain continues since last visit with leg weakness.  Reports he now has constant pressure in bowel and bladder area. He will have pain with bowel movents and voiding.      PROBLEM LIST: Past Medical History:  Diagnosis Date  . Anemia   . Depression   . GERD (gastroesophageal reflux disease)   . Hyperlipidemia   . Hypertension   . 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 Tremor     Past Surgical History:  Procedure Laterality Date  . RADICIAL NECK DISSECTION  2002  . TONSILLECTOMY AND ADENOIDECTOMY  2002   [place]  . C6-7 artificial disc  01/09/2018   Dr Reeves Daisy - [facility]  . APPENDECTOMY    . OTHER SURGERY     Esophageal dilation 11/2023 pe patient.    ALLERGIES: No Active Allergies  CURRENT MEDICATIONS: Current Outpatient Medications  Medication Sig Dispense Refill  . albuterol  MDI, PROVENTIL , VENTOLIN , PROAIR , HFA 90 mcg/actuation inhaler Inhale 2 Inhalations into the lungs every 6 (six) hours as needed    . ALPRAZolam (XANAX) 0.5 MG tablet Take 1 tablet (0.5 mg total) by mouth at bedtime as needed 30 tablet 5  . amLODIPine  (NORVASC ) 5 MG tablet Take 1 tablet (5 mg total) by mouth once daily 90 tablet 3  . aspirin 325 MG tablet Take 325 mg by mouth once daily    . atorvastatin  (LIPITOR) 20 MG tablet Take 1 tablet (20 mg total) by mouth once daily 90 tablet 3  . buPROPion  (WELLBUTRIN  XL) 150 MG XL tablet Take 1 tablet (150 mg total) by mouth once daily 90 tablet 3  . cyanocobalamin (VITAMIN B12) 1000 MCG tablet Take 1,000 mcg by mouth once a week 500 or 1000 mcg once a week    . cyclobenzaprine  (FLEXERIL ) 10 MG tablet Take 10 mg by mouth 3 (three) times daily as needed for Muscle spasms    . famotidine (PEPCID) 40 MG tablet Take 40 mg by mouth at bedtime    . levothyroxine (SYNTHROID) 25  MCG tablet Take 1 tablet (25 mcg total) by mouth every morning before breakfast (0630) 90 tablet 3  . olmesartan (BENICAR) 20 MG tablet Take 0.5 tablets (10 mg total) by mouth once daily 45 tablet 3  . pantoprazole  (PROTONIX ) 40 MG DR tablet Take 1 tablet (40 mg total) by mouth once daily 90 tablet 3  . PARoxetine  (PAXIL ) 20 MG tablet Take 1 tablet (20 mg total) by mouth every morning 90 tablet 3   No current facility-administered medications for this visit.      HPI   CLINICAL SUMMARY:  Patient presents with progressive leg weakness.  Has fallen a couple times.  Cannot bear weight on 1 leg at a time.  Cannot stand up without pushing off.  Having urine incontinence, bladder pressure, frequent urination, feeling of incomplete bladder and bowel emptying.  Has diffuse back pain.  No disease at L2-3.  Failed L4-5 bilateral ESI.  Only got nominal improvement with quick recurrence  ROS: Review of systems is unremarkable for any active cardiac, respiratory, GI, GU, hematologic, neurologic, dermatologic, HEENT, or psychiatric symptoms except as noted above, 10 systems reviewed.  No fevers, chills, or constitutional symptoms.   PHYSICAL EXAM  Vital signs:  BP 120/80   Pulse 90   Wt 88.3 kg (194 lb 9.6 oz)   SpO2 96%   BMI 24.99 kg/m  Body mass index is 24.99 kg/m.   Wt Readings from Last 3 Encounters:  06/18/24 88.3 kg (194 lb 9.6 oz)  06/11/24 86.6 kg (191 lb)  06/06/24 86.6 kg (191 lb)     BP Readings from Last 3 Encounters:  06/18/24 120/80  06/11/24 118/78  06/06/24 120/80    Constitutional: Acute distress Neck: supple, no thyromegaly, good ROM Respiratory:clear to auscultation, no rales or wheezes Cardiovascular:RRR, no murmur or gallop Abdominal:soft, good BS, NT Ext: no edema, good peripheral pulses Neuro: alert and oriented X 3, 4/5 lower extremity weakness     ASSESSMENT/PLAN   Lumbar myelopathy-surgical urgency/emergency, associated urinary/pelvic symptoms.  Not  responding to any treatment.  Minimal response to ESI.  Referral back to [facility] for urgent surgery.  Tramadol as needed for pain Low surgical risk  Dispo:   No follow-ups on file.

## 2024-06-19 ENCOUNTER — Encounter: Payer: Self-pay | Admitting: Neurosurgery

## 2024-06-19 ENCOUNTER — Ambulatory Visit (INDEPENDENT_AMBULATORY_CARE_PROVIDER_SITE_OTHER): Admitting: Neurosurgery

## 2024-06-19 ENCOUNTER — Encounter
Admission: RE | Admit: 2024-06-19 | Discharge: 2024-06-19 | Disposition: A | Discharge status: Home / Self Care | Source: Ambulatory Visit | Attending: Neurosurgery | Admitting: Neurosurgery

## 2024-06-19 ENCOUNTER — Other Ambulatory Visit: Payer: Self-pay

## 2024-06-19 VITALS — BP 150/118 | Ht 74.0 in | Wt 195.1 lb

## 2024-06-19 DIAGNOSIS — Z01812 Encounter for preprocedural laboratory examination: Secondary | ICD-10-CM | POA: Diagnosis present

## 2024-06-19 DIAGNOSIS — G834 Cauda equina syndrome: Secondary | ICD-10-CM

## 2024-06-19 DIAGNOSIS — M48062 Spinal stenosis, lumbar region with neurogenic claudication: Secondary | ICD-10-CM

## 2024-06-19 DIAGNOSIS — Z01818 Encounter for other preprocedural examination: Secondary | ICD-10-CM

## 2024-06-19 DIAGNOSIS — M7138 Other bursal cyst, other site: Secondary | ICD-10-CM

## 2024-06-19 DIAGNOSIS — R2689 Other abnormalities of gait and mobility: Secondary | ICD-10-CM

## 2024-06-19 LAB — TYPE AND SCREEN
ABO/RH(D): B POS
Antibody Screen: NEGATIVE

## 2024-06-19 LAB — SURGICAL PCR SCREEN
MRSA, PCR: NEGATIVE
Staphylococcus aureus: NEGATIVE

## 2024-06-19 NOTE — Patient Instructions (Signed)
 Please call [facility] Scheduling to schedule your thoracic MRI: [phone]  Alternate option: [facility]: [phone]   Please see below for information in regards to your upcoming surgery:   Planned surgery: L3-4 decompression   Surgery date: 06/25/24 at [facility] (Medical Mall: [address] [place], [place] [address]) - you will find out your arrival time the business day before your surgery.    Pre-op appointment at [facility] Pre-admit Testing: you will receive a call with a date/time for this appointment. If you are scheduled for an in person appointment, Pre-admit Testing is located on the first floor of the [facility] building, [address], [address]. During this appointment, they will advise you which medications you can take the morning of surgery, and which medications you will need to hold for surgery. Labs (such as blood work, EKG) may be done at your pre-op appointment. You are not required to fast for these labs. Should you need to change your pre-op appointment, please call Pre-admit testing at [phone].     Blood thinners:    Aspirin 81mg :   stop aspirin 7 days prior, resume aspirin 14 days after      Common restrictions after spine surgery: No bending, lifting, or twisting ("BLT"). Avoid lifting objects heavier than 10 pounds for the first 6 weeks after surgery. Where possible, avoid household activities that involve lifting, bending, reaching, pushing, or pulling such as laundry, vacuuming, grocery shopping, and childcare. Try to arrange for help from friends and family for these activities while you heal. Do not drive while taking prescription pain medication. Weeks 6 through 12 after surgery: avoid lifting more than 25 pounds.      How to contact us :  If you have any questions/concerns before or after surgery, you can reach us  at [phone], or you can send a mychart message. We can be reached by phone  or mychart 8am-4pm, Monday-Friday.  *Please note: Calls after 4pm are forwarded to a third party answering service. Mychart messages are not routinely monitored during evenings, weekends, and holidays. Please call our office to contact the answering service for urgent concerns during non-business hours.    If you have FMLA/disability paperwork, please drop it off or fax it to [phone]   Appointments/FMLA & disability paperwork: [employer] Registered Nurse/Surgery scheduler: Sarabi Sockwell, RN Certified Medical Assistants: Don, CMA, Elenor, CMA, & Damien, CMA Physician Assistants: Lyle Decamp, PA-C, Edsel Goods, PA-C & Glade Boys, PA-C Surgeons: Penne Sharps, MD & Reeves Daisy, MD   [facility] PREADMIT TESTING VISIT and SURGERY INFORMATION SHEET   Now that surgery has been scheduled you can anticipate several phone calls from [facility] services. A pharmacy technician will call you to verify your current list of medications taken at home.               The Pre-Service Center will call to verify your insurance information and to give you billing estimates and information.             The Preadmit Testing Office will be calling to schedule a visit to obtain information for the anesthesia team and provide instructions on preparation for surgery.  What can you expect for the Preadmit Testing Visit: Appointments may be scheduled in-person or by telephone.  If a telephone visit is scheduled, you may be asked to come into the office to have lab tests or other studies performed.   This visit will not be completed any  greater than 14 days prior to your surgery.  If your surgery has been scheduled for a future date, please do not be alarmed if we have not contacted you to schedule an appointment more than a month prior to the surgery date.    Please be prepared to provide the following information during this appointment:            -Personal medical  history                                               -Medication and allergy list            -Any history of problems with anesthesia              -Recent lab work or diagnostic studies            -Please notify us  of any needs we should be aware of to provide the best care possible           -You will be provided with instructions on how to prepare for your surgery.    On The Day of Surgery:  You must have a driver to take you home after surgery, you will be asked not to drive for 24 hours following surgery.  Taxi, Gisele and non-medical transport will not be acceptable means of transportation unless you have a responsible individual who will be traveling with you.  Visitors in the surgical area:   2 people will be able to visit you in your room once your preparation for surgery has been completed. During surgery, your visitors will be asked to wait in the Surgery Waiting Area.  It is not a requirement for them to stay, if they prefer to leave and come back.  Your visitor(s) will be given an update once the surgery has been completed.  No visitors are allowed in the initial recovery room to respect patient privacy and safety.  Once you are more awake and transfer to the secondary recovery area, or are transferred to an inpatient room, visitors will again be able to see you.  To respect and protect your privacy: We will ask on the day of surgery who your driver will be and what the contact number for that individual will be. We will ask if it is okay to share information with this individual, or if there is an alternative individual that we, or the surgeon, should contact to provide updates and information. If family or friends come to the surgical information desk requesting information about you, who you have not listed with us , no information will be given.   It may be helpful to designate someone as the main contact who will be responsible for updating your other friends and family.     PREADMIT TESTING OFFICE: [phone] SAME DAY SURGERY: [phone] We look forward to caring for you before and throughout the process of your surgery.

## 2024-06-19 NOTE — Progress Notes (Signed)
 Referring Physician:  Cleotilde Oneil FALCON, MD [address] [facility] [address] [facility] [place],  [place] [address]  Primary Physician:  Cleotilde Oneil FALCON, MD  History of Present Illness: 06/19/2024 Mr. Schnitzler has had worsening bowel and bladder urgency.  He has never had this type of issue before.  He comes in today with continued severe left leg pain as well as some issues with urination.  06/09/2024 Mr. Osmar Howton is here today with a chief complaint of back pain that began last week.  It is worse when he changes positions.  He has had trouble getting comfortable particularly when he sits or lays down or has to change positions.  The pain is in his low back on both sides.  He has never had an episode like this before.  Over the weekend, he began having some discomfort into his groin, testicles, and anterior thighs as well.  He started a steroid taper which may have helped dull his pain from a 9 to an 8.  Bowel/Bladder Dysfunction: none  Conservative measures:  Physical therapy:  has not participated in recently Multimodal medical therapy including regular antiinflammatories:  Medrol  dose Pak, Percocet, Tylenol  Injections:  12/24/2017-ESI right side C7-T1   Past Surgery:  RADICIAL NECK DISSECTION 2002 and 2014 C6-7 artificial disc 01/09/2018   Toribio VEAR Lim has no symptoms of cervical myelopathy.  The symptoms are causing a significant impact on the patient's life.   I have utilized the care everywhere function in epic to review the outside records available from external health systems.  Review of Systems:  A 10 point review of systems is negative, except for the pertinent positives and negatives detailed in the HPI.  Past Medical History: Past Medical History:  Diagnosis Date   Depression    Difficult intubation    Very anterior and cephalad glottis.SABRAsuccessful with macgraph and styletted ett.   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HCC)    lef sided oropharyngeal SCC s\p resection\radiation   Pneumonia     Past Surgical History: Past Surgical History:  Procedure Laterality Date   ADENOIDECTOMY     APPENDECTOMY     CERVICAL DISC ARTHROPLASTY Right 01/09/2018   Procedure: CERVICAL ANTERIOR DISC ARTHROPLASTY-C6-7;  Surgeon: Clois Fret, MD;  Location: [facility] ORS;  Service: Neurosurgery;  Laterality: Right;   RADICAL NECK DISSECTION  2002 and 2014   left cervical neck   TONSILLECTOMY      Allergies: Allergies as of 06/19/2024   (No Known Allergies)    Medications:  Current Outpatient Medications:    acetaminophen  (TYLENOL ) 500 MG tablet, Take 500 mg by mouth daily as needed for moderate pain or headache., Disp: , Rfl:    amLODipine  (NORVASC ) 10 MG tablet, Take 10 mg by mouth daily., Disp: , Rfl:    aspirin 81 MG chewable tablet, Chew 81 mg by mouth daily., Disp: , Rfl:    atorvastatin  (LIPITOR) 20 MG tablet, Take 20 mg by mouth every evening., Disp: , Rfl:    buPROPion  (WELLBUTRIN  XL) 150 MG 24 hr tablet, Take 150 mg by mouth daily., Disp: , Rfl:    Cyanocobalamin (B-12) 2500 MCG SUBL, Place 2,500 mcg under the tongue every Sunday., Disp: , Rfl:    cyclobenzaprine  (FLEXERIL ) 10 MG tablet, Take 1 tablet (10 mg total) by mouth 3 (three) times  daily as needed for muscle spasms., Disp: 30 tablet, Rfl: 0   famotidine (PEPCID) 40 MG tablet, Take 40 mg by mouth., Disp: , Rfl:    loratadine  (CLARITIN ) 10 MG tablet, Take 10 mg by mouth daily as needed for allergies., Disp: , Rfl:    olmesartan (BENICAR) 20 MG tablet, Take 20 mg by mouth daily., Disp: , Rfl:    oxyCODONE -acetaminophen  (PERCOCET) 5-325 MG tablet, Take 1 tablet by mouth every 8 (eight) hours as needed., Disp: 20 tablet, Rfl: 0   pantoprazole  (PROTONIX ) 40 MG tablet, Take 40 mg by mouth every evening. , Disp: , Rfl:     PARoxetine  (PAXIL ) 20 MG tablet, Take 20 mg by mouth daily., Disp: , Rfl:    traMADol (ULTRAM) 50 MG tablet, Take 50 mg by mouth every 6 (six) hours as needed., Disp: , Rfl:   Social History: Social History   Tobacco Use   Smoking status: Never   Smokeless tobacco: Never  Vaping Use   Vaping status: Never Used  Substance Use Topics   Alcohol use: Yes    Alcohol/week: 3.0 standard drinks of alcohol    Types: 3 Cans of beer per week   Drug use: No    Family Medical History: No family history on file.  Physical Examination: Vitals:   06/19/24 1423  BP: (!) 164/120    General: Patient is in no apparent distress. Attention to examination is appropriate.  Neck:   Supple.  Full range of motion.  Respiratory: Patient is breathing without any difficulty.   NEUROLOGICAL:     Awake, alert, oriented to person, place, and time.  Speech is raspy but clear and fluent.  He has a known VC paralysis.  Cranial Nerves: Pupils equal round and reactive to light.  Facial tone is symmetric.  Facial sensation is symmetric. Shoulder shrug is symmetric. Tongue protrusion is midline.   Strength: UE 5/5  Side Iliopsoas Quads Hamstring PF DF EHL  R 5 5 5 5 5 5   L 4+ 4+ 5 5 5 5    Reflexes are 1+ and symmetric at the biceps, triceps, brachioradialis, patella and achilles.   Hoffman's is absent.   Bilateral upper and lower extremity sensation is intact to light touch.    No evidence of dysmetria noted.  Gait is normal.  He has difficulty changing positions   Medical Decision Making  Imaging: MRI L spine 06/06/2024 Disc levels:   T12-L1: No impingement. Small left paracentral disc protrusion and left foraminal annular tear.   L1-2: Unremarkable   L2-3: No impingement. Mild disc bulge and mild degenerative facet arthropathy.   L3-4: Moderate to prominent central narrowing of the thecal sac with borderline left foraminal stenosis due to degenerative facet arthropathy, ligamentum  flavum redundancy, small synovial cyst in the left ligamentum flavum, and diffuse disc bulge.   L4-5: No impingement. Diffuse disc bulge and right greater than left facet arthropathy. Mild central annular tearing.   L5-S1: No impingement. Mild disc osteophyte complex. Minimal left lateral recess annular tear.   IMPRESSION: 1. Lumbar spondylosis and degenerative disc disease, causing moderate to prominent central narrowing of the thecal sac at L3-4 and borderline left foraminal stenosis at L3-4. 2. Degenerative facet edema bilaterally at L3-4 and L4-5. 3. 4 mm degenerative retrolisthesis at L2-3.     Electronically Signed   By: Ryan Salvage M.D.   On: 06/06/2024 15:42  I have personally reviewed the images and agree with the above interpretation.  Assessment and Plan: Mr.  Wafer is a pleasant 63 y.o. male with lumbar spine findings most significant at L3-4.  He has at least moderate stenosis at that level.  He also has a synovial cyst at L3-4 on the left.  I think this is his most likely symptomatic level.  I am not sure that this fully explains his symptoms.  I would like to get a thoracic MRI scan.  He does have symptoms of cauda equina compression.  As such, decompression is indicated and no further conservative management is appropriate.  I would like to get his thoracic MRI scan prior to intervention as he could also possibly have a thoracic disc herniation that could explain his symptoms.  I discussed the planned procedure at length with the patient, including the risks, benefits, alternatives, and indications. The risks discussed include but are not limited to bleeding, infection, need for reoperation, spinal fluid leak, stroke, vision loss, anesthetic complication, coma, paralysis, and even death. I also described in detail that improvement was not guaranteed.  The patient expressed understanding of these risks, and asked that we proceed with surgery. I described the  surgery in layman's terms, and gave ample opportunity for questions, which were answered to the best of my ability.  I spent a total of 20 minutes in this patient's care today. This time was spent reviewing pertinent records including imaging studies, obtaining and confirming history, performing a directed evaluation, formulating and discussing my recommendations, and documenting the visit within the medical record.     Thank you for involving me in the care of this patient.      Olinda Nola K. Clois MD, [facility]

## 2024-06-19 NOTE — H&P (View-Only) (Signed)
 Referring Physician:  Cleotilde Oneil FALCON, MD [address] [facility] [address] [facility] [place],  [place] [address]  Primary Physician:  Cleotilde Oneil FALCON, MD  History of Present Illness: 06/19/2024 Mr. Schnitzler has had worsening bowel and bladder urgency.  He has never had this type of issue before.  He comes in today with continued severe left leg pain as well as some issues with urination.  06/09/2024 Mr. Osmar Howton is here today with a chief complaint of back pain that began last week.  It is worse when he changes positions.  He has had trouble getting comfortable particularly when he sits or lays down or has to change positions.  The pain is in his low back on both sides.  He has never had an episode like this before.  Over the weekend, he began having some discomfort into his groin, testicles, and anterior thighs as well.  He started a steroid taper which may have helped dull his pain from a 9 to an 8.  Bowel/Bladder Dysfunction: none  Conservative measures:  Physical therapy:  has not participated in recently Multimodal medical therapy including regular antiinflammatories:  Medrol  dose Pak, Percocet, Tylenol  Injections:  12/24/2017-ESI right side C7-T1   Past Surgery:  RADICIAL NECK DISSECTION 2002 and 2014 C6-7 artificial disc 01/09/2018   Toribio VEAR Lim has no symptoms of cervical myelopathy.  The symptoms are causing a significant impact on the patient's life.   I have utilized the care everywhere function in epic to review the outside records available from external health systems.  Review of Systems:  A 10 point review of systems is negative, except for the pertinent positives and negatives detailed in the HPI.  Past Medical History: Past Medical History:  Diagnosis Date   Depression    Difficult intubation    Very anterior and cephalad glottis.SABRAsuccessful with macgraph and styletted ett.   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HCC)    lef sided oropharyngeal SCC s\p resection\radiation   Pneumonia     Past Surgical History: Past Surgical History:  Procedure Laterality Date   ADENOIDECTOMY     APPENDECTOMY     CERVICAL DISC ARTHROPLASTY Right 01/09/2018   Procedure: CERVICAL ANTERIOR DISC ARTHROPLASTY-C6-7;  Surgeon: Clois Fret, MD;  Location: [facility] OR[facility];  Service: Neurosurgery;  Laterality: Right;   RADICAL NECK DISSECTION  2002 and 2014   left cervical neck   TONSILLECTOMY      Allergies: Allergies as of 06/19/2024   (No Known Allergies)    Medications:  Current Outpatient Medications:    acetaminophen  (TYLENOL ) 500 MG tablet, Take 500 mg by mouth daily as needed for moderate pain or headache., Disp: , Rfl:    amLODipine  (NORVASC ) 10 MG tablet, Take 10 mg by mouth daily., Disp: , Rfl:    aspirin 81 MG chewable tablet, Chew 81 mg by mouth daily., Disp: , Rfl:    atorvastatin  (LIPITOR) 20 MG tablet, Take 20 mg by mouth every evening., Disp: , Rfl:    buPROPion  (WELLBUTRIN  XL) 150 MG 24 hr tablet, Take 150 mg by mouth daily., Disp: , Rfl:    Cyanocobalamin (B-12) 2500 MCG SUBL, Place 2,500 mcg under the tongue every Sunday., Disp: , Rfl:    cyclobenzaprine  (FLEXERIL ) 10 MG tablet, Take 1 tablet (10 mg total) by mouth 3 (three) times  daily as needed for muscle spasms., Disp: 30 tablet, Rfl: 0   famotidine (PEPCID) 40 MG tablet, Take 40 mg by mouth., Disp: , Rfl:    loratadine  (CLARITIN ) 10 MG tablet, Take 10 mg by mouth daily as needed for allergies., Disp: , Rfl:    olmesartan (BENICAR) 20 MG tablet, Take 20 mg by mouth daily., Disp: , Rfl:    oxyCODONE -acetaminophen  (PERCOCET) 5-325 MG tablet, Take 1 tablet by mouth every 8 (eight) hours as needed., Disp: 20 tablet, Rfl: 0   pantoprazole  (PROTONIX ) 40 MG tablet, Take 40 mg by mouth every evening. , Disp: , Rfl:     PARoxetine  (PAXIL ) 20 MG tablet, Take 20 mg by mouth daily., Disp: , Rfl:    traMADol (ULTRAM) 50 MG tablet, Take 50 mg by mouth every 6 (six) hours as needed., Disp: , Rfl:   Social History: Social History   Tobacco Use   Smoking status: Never   Smokeless tobacco: Never  Vaping Use   Vaping status: Never Used  Substance Use Topics   Alcohol use: Yes    Alcohol/week: 3.0 standard drinks of alcohol    Types: 3 Cans of beer per week   Drug use: No    Family Medical History: No family history on file.  Physical Examination: Vitals:   06/19/24 1423  BP: (!) 164/120    General: Patient is in no apparent distress. Attention to examination is appropriate.  Neck:   Supple.  Full range of motion.  Respiratory: Patient is breathing without any difficulty.   NEUROLOGICAL:     Awake, alert, oriented to person, place, and time.  Speech is raspy but clear and fluent.  He has a known VC paralysis.  Cranial Nerves: Pupils equal round and reactive to light.  Facial tone is symmetric.  Facial sensation is symmetric. Shoulder shrug is symmetric. Tongue protrusion is midline.   Strength: UE 5/5  Side Iliopsoas Quads Hamstring PF DF EHL  R 5 5 5 5 5 5   L 4+ 4+ 5 5 5 5    Reflexes are 1+ and symmetric at the biceps, triceps, brachioradialis, patella and achilles.   Hoffman's is absent.   Bilateral upper and lower extremity sensation is intact to light touch.    No evidence of dysmetria noted.  Gait is normal.  He has difficulty changing positions   Medical Decision Making  Imaging: MRI L spine 06/06/2024 Disc levels:   T12-L1: No impingement. Small left paracentral disc protrusion and left foraminal annular tear.   L1-2: Unremarkable   L2-3: No impingement. Mild disc bulge and mild degenerative facet arthropathy.   L3-4: Moderate to prominent central narrowing of the thecal sac with borderline left foraminal stenosis due to degenerative facet arthropathy, ligamentum  flavum redundancy, small synovial cyst in the left ligamentum flavum, and diffuse disc bulge.   L4-5: No impingement. Diffuse disc bulge and right greater than left facet arthropathy. Mild central annular tearing.   L5-S1: No impingement. Mild disc osteophyte complex. Minimal left lateral recess annular tear.   IMPRESSION: 1. Lumbar spondylosis and degenerative disc disease, causing moderate to prominent central narrowing of the thecal sac at L3-4 and borderline left foraminal stenosis at L3-4. 2. Degenerative facet edema bilaterally at L3-4 and L4-5. 3. 4 mm degenerative retrolisthesis at L2-3.     Electronically Signed   By: Ryan Salvage M.D.   On: 06/06/2024 15:42  I have personally reviewed the images and agree with the above interpretation.  Assessment and Plan: Mr.  Wafer is a pleasant 63 y.o. male with lumbar spine findings most significant at L3-4.  He has at least moderate stenosis at that level.  He also has a synovial cyst at L3-4 on the left.  I think this is his most likely symptomatic level.  I am not sure that this fully explains his symptoms.  I would like to get a thoracic MRI scan.  He does have symptoms of cauda equina compression.  As such, decompression is indicated and no further conservative management is appropriate.  I would like to get his thoracic MRI scan prior to intervention as he could also possibly have a thoracic disc herniation that could explain his symptoms.  I discussed the planned procedure at length with the patient, including the risks, benefits, alternatives, and indications. The risks discussed include but are not limited to bleeding, infection, need for reoperation, spinal fluid leak, stroke, vision loss, anesthetic complication, coma, paralysis, and even death. I also described in detail that improvement was not guaranteed.  The patient expressed understanding of these risks, and asked that we proceed with surgery. I described the  surgery in layman's terms, and gave ample opportunity for questions, which were answered to the best of my ability.  I spent a total of 20 minutes in this patient's care today. This time was spent reviewing pertinent records including imaging studies, obtaining and confirming history, performing a directed evaluation, formulating and discussing my recommendations, and documenting the visit within the medical record.     Thank you for involving me in the care of this patient.      Olinda Nola K. Clois MD, [facility]

## 2024-06-19 NOTE — Patient Instructions (Signed)
Pre-operative 5 CHG Bath Instructions   You can play a key role in reducing the risk of infection after surgery. Your skin needs to be as free of germs as possible. You can reduce the number of germs on your skin by washing with CHG (chlorhexidine gluconate) soap before surgery. CHG is an antiseptic soap that kills germs and continues to kill germs even after washing.   DO NOT use if you have an allergy to chlorhexidine/CHG or antibacterial soaps. If your skin becomes reddened or irritated, stop using the CHG and notify one of our RNs at [phone].   Please shower with the CHG soap starting 4 days before surgery using the following schedule:     Please keep in mind the following:  DO NOT shave, including legs and underarms, starting the day of your first shower.   You may shave your face at any point before/day of surgery.  Place clean sheets on your bed the day you start using CHG soap. Use a clean washcloth (not used since being washed) for each shower. DO NOT sleep with pets once you start using the CHG.   CHG Shower Instructions:  If you choose to wash your hair and private area, wash first with your normal shampoo/soap.  After you use shampoo/soap, rinse your hair and body thoroughly to remove shampoo/soap residue.  Turn the water OFF and apply about 3 tablespoons (45 ml) of CHG soap to a CLEAN washcloth.  Apply CHG soap ONLY FROM YOUR NECK DOWN TO YOUR TOES (washing for 3-5 minutes)  DO NOT use CHG soap on face, private areas, open wounds, or sores.  Pay special attention to the area where your surgery is being performed.  If you are having back surgery, having someone wash your back for you may be helpful. Wait 2 minutes after CHG soap is applied, then you may rinse off the CHG soap.  Pat dry with a clean towel  Put on clean clothes/pajamas   If you choose to wear lotion, please use ONLY the CHG-compatible lotions on the back of this paper.     Additional instructions for  the day of surgery: DO NOT APPLY any lotions, deodorants, cologne, or perfumes.   Put on clean/comfortable clothes.  Brush your teeth.  Ask your nurse before applying any prescription medications to the skin.      CHG Compatible Lotions   Aveeno Moisturizing lotion  Cetaphil Moisturizing Cream  Cetaphil Moisturizing Lotion  Clairol Herbal Essence Moisturizing Lotion, Dry Skin  Clairol Herbal Essence Moisturizing Lotion, Extra Dry Skin  Clairol Herbal Essence Moisturizing Lotion, Normal Skin  Curel Age Defying Therapeutic Moisturizing Lotion with Alpha Hydroxy  Curel Extreme Care Body Lotion  Curel Soothing Hands Moisturizing Hand Lotion  Curel Therapeutic Moisturizing Cream, Fragrance-Free  Curel Therapeutic Moisturizing Lotion, Fragrance-Free  Curel Therapeutic Moisturizing Lotion, Original Formula  Eucerin Daily Replenishing Lotion  Eucerin Dry Skin Therapy Plus Alpha Hydroxy Crme  Eucerin Dry Skin Therapy Plus Alpha Hydroxy Lotion  Eucerin Original Crme  Eucerin Original Lotion  Eucerin Plus Crme Eucerin Plus Lotion  Eucerin TriLipid Replenishing Lotion  Keri Anti-Bacterial Hand Lotion  Keri Deep Conditioning Original Lotion Dry Skin Formula Softly Scented  Keri Deep Conditioning Original Lotion, Fragrance Free Sensitive Skin Formula  Keri Lotion Fast Absorbing Fragrance Free Sensitive Skin Formula  Keri Lotion Fast Absorbing Softly Scented Dry Skin Formula  Keri Original Lotion  Keri Skin Renewal Lotion Keri Silky Smooth Lotion  Keri Silky Smooth Sensitive Skin Lotion  Nivea Body Creamy Conditioning Oil  Nivea Body Extra Enriched Teacher, adult education Moisturizing Lotion Nivea Crme  Nivea Skin Firming Lotion  NutraDerm 30 Skin Lotion  NutraDerm Skin Lotion  NutraDerm Therapeutic Skin Cream  NutraDerm Therapeutic Skin Lotion  ProShield Protective Hand Cream  Provon moisturizing lotion

## 2024-06-20 ENCOUNTER — Ambulatory Visit
Admission: RE | Admit: 2024-06-20 | Discharge: 2024-06-20 | Disposition: A | Discharge status: Home / Self Care | Source: Ambulatory Visit | Attending: Neurosurgery | Admitting: Neurosurgery

## 2024-06-20 DIAGNOSIS — R2689 Other abnormalities of gait and mobility: Secondary | ICD-10-CM | POA: Diagnosis present

## 2024-06-24 ENCOUNTER — Other Ambulatory Visit: Payer: Self-pay

## 2024-06-24 ENCOUNTER — Encounter
Admission: RE | Admit: 2024-06-24 | Discharge: 2024-06-24 | Disposition: A | Discharge status: Home / Self Care | Source: Ambulatory Visit | Attending: Neurosurgery | Admitting: Neurosurgery

## 2024-06-24 MED ORDER — CEFAZOLIN IN SODIUM CHLORIDE 2-0.9 GM/100ML-% IV SOLN
2.0000 g | Freq: Once | INTRAVENOUS | Status: DC
  Filled 2024-06-24: qty 100

## 2024-06-24 MED ORDER — CEFAZOLIN SODIUM-DEXTROSE 2-4 GM/100ML-% IV SOLN
2.0000 g | INTRAVENOUS | Status: AC
  Administered 2024-06-25 (×2): 2 g via INTRAVENOUS

## 2024-06-24 MED ORDER — ORAL CARE MOUTH RINSE: 15.0000 mL | Freq: Once | OROMUCOSAL | Status: AC

## 2024-06-24 MED ORDER — LACTATED RINGERS IV SOLN: INTRAVENOUS | Status: DC

## 2024-06-24 MED ORDER — CHLORHEXIDINE GLUCONATE 0.12 % MT SOLN
15.0000 mL | Freq: Once | OROMUCOSAL | Status: AC
  Administered 2024-06-25 (×2): 15 mL via OROMUCOSAL

## 2024-06-24 NOTE — Patient Instructions (Addendum)
 Your procedure is scheduled on: Tomorrow 06/25/24 Report to the Registration Desk on the 1st floor of the [facility]. To find out your arrival time, please call [phone] between 1PM - 3PM on: Today 06/24/24 If your arrival time is 6:00 am, do not arrive before that time as the [facility] entrance doors do not open until 6:00 am.  REMEMBER: Instructions that are not followed completely may result in serious medical risk, up to and including death; or upon the discretion of your surgeon and anesthesiologist your surgery may need to be rescheduled.  Do not eat food or drink any liquids after midnight the night before surgery.  No gum chewing or hard candies.  One week prior to surgery: Stop Anti-inflammatories (NSAIDS) such as Advil, Aleve, Ibuprofen, Motrin, Naproxen, Naprosyn and Aspirin based products such as Excedrin, Goody's Powder, BC Powder.  You may however, continue to take Tylenol  if needed for pain up until the day of surgery.  Stop ALL OVER THE COUNTER supplements and vitamins until after surgery.  **Follow recommendations regarding stopping blood thinners.** Aspirin held since Friday per pt.  Continue taking all of your other prescription medications up until the day of surgery.  ON THE DAY OF SURGERY ONLY TAKE THESE MEDICATIONS WITH SIPS OF WATER:  PARoxetine  (PAXIL ) 20 MG tablet  loratadine  (CLARITIN ) 10 MG tablet  famotidine (PEPCID) 40 MG tablet  buPROPion  (WELLBUTRIN  XL) 150 MG 24 hr tablet   No Alcohol for 24 hours before or after surgery.  No Smoking including e-cigarettes for 24 hours before surgery.  No chewable tobacco products for at least 6 hours before surgery.  No nicotine patches on the day of surgery.  Do not use any recreational drugs for at least a week (preferably 2 weeks) before your surgery.  Please be advised that the combination of cocaine and anesthesia may have negative outcomes, up to and including death. If you test positive for  cocaine, your surgery will be cancelled.  On the morning of surgery brush your teeth with toothpaste and water, you may rinse your mouth with mouthwash if you wish. Do not swallow any toothpaste or mouthwash.  Use CHG Soap or wipes as directed on instruction sheet.  Do not shave body hair from the neck down 48 hours before surgery. Can shave face and neck.  Do not wear lotions, powders, or cologne.  Wear comfortable clothing (specific to your surgery type) to the hospital.  Do not wear jewelry, make-up, hairpins, clips or nail polish.  For welded (permanent) jewelry: bracelets, anklets, waist bands, etc.  Please have this removed prior to surgery.  If it is not removed, there is a chance that hospital personnel will need to cut it off on the day of surgery.  Contact lenses, hearing aids and dentures may not be worn into surgery. Bring a case for your glasses  Do not bring valuables to the hospital. [facility] is not responsible for any missing/lost belongings or valuables.   Notify your doctor if there is any change in your medical condition (cold, fever, infection).  After surgery, you can help prevent lung complications by doing breathing exercises.  Take deep breaths and cough every 1-2 hours. Your doctor may order a device called an Incentive Spirometer to help you take deep breaths.  If you are being discharged the day of surgery, you will not be allowed to drive home. You will need a responsible individual to drive you home and stay with you for 24 hours after  surgery.   Please call the Pre-admissions Testing Dept. at [phone] if you have any questions about these instructions.  Surgery Visitation Policy:  Patients having surgery or a procedure may have two visitors.  Children under the age of 16 must have an adult with them who is not the patient.   Merchandiser, retail to address health-related social needs:  [email or web address]

## 2024-06-25 ENCOUNTER — Encounter
Admission: RE | Disposition: A | Discharge status: Home / Self Care | Payer: Self-pay | Source: Home / Self Care | Attending: Neurosurgery

## 2024-06-25 ENCOUNTER — Ambulatory Visit: Payer: Self-pay | Admitting: Urgent Care

## 2024-06-25 ENCOUNTER — Ambulatory Visit

## 2024-06-25 ENCOUNTER — Ambulatory Visit: Payer: Self-pay

## 2024-06-25 ENCOUNTER — Other Ambulatory Visit: Payer: Self-pay

## 2024-06-25 ENCOUNTER — Encounter: Payer: Self-pay | Admitting: Neurosurgery

## 2024-06-25 ENCOUNTER — Ambulatory Visit
Admission: RE | Admit: 2024-06-25 | Discharge: 2024-06-25 | Disposition: A | Discharge status: Home / Self Care | Attending: Neurosurgery | Admitting: Neurosurgery

## 2024-06-25 DIAGNOSIS — Z01818 Encounter for other preprocedural examination: Secondary | ICD-10-CM

## 2024-06-25 DIAGNOSIS — K219 Gastro-esophageal reflux disease without esophagitis: Secondary | ICD-10-CM | POA: Diagnosis not present

## 2024-06-25 DIAGNOSIS — I10 Essential (primary) hypertension: Secondary | ICD-10-CM | POA: Insufficient documentation

## 2024-06-25 DIAGNOSIS — G834 Cauda equina syndrome: Secondary | ICD-10-CM

## 2024-06-25 DIAGNOSIS — M7138 Other bursal cyst, other site: Secondary | ICD-10-CM | POA: Diagnosis not present

## 2024-06-25 DIAGNOSIS — M51369 Other intervertebral disc degeneration, lumbar region without mention of lumbar back pain or lower extremity pain: Secondary | ICD-10-CM | POA: Diagnosis not present

## 2024-06-25 DIAGNOSIS — M48062 Spinal stenosis, lumbar region with neurogenic claudication: Secondary | ICD-10-CM

## 2024-06-25 DIAGNOSIS — M4316 Spondylolisthesis, lumbar region: Secondary | ICD-10-CM | POA: Diagnosis not present

## 2024-06-25 DIAGNOSIS — M47816 Spondylosis without myelopathy or radiculopathy, lumbar region: Secondary | ICD-10-CM | POA: Diagnosis not present

## 2024-06-25 HISTORY — PX: LUMBAR LAMINECTOMY/DECOMPRESSION MICRODISCECTOMY: SHX5026

## 2024-06-25 SURGERY — LUMBAR LAMINECTOMY/DECOMPRESSION MICRODISCECTOMY 1 LEVEL
Anesthesia: General | Site: Spine Lumbar

## 2024-06-25 MED ORDER — ONDANSETRON HCL 4 MG/2ML IJ SOLN
INTRAMUSCULAR | Status: DC | PRN
  Administered 2024-06-25 (×2): 4 mg via INTRAVENOUS

## 2024-06-25 MED ORDER — REMIFENTANIL HCL 1 MG IV SOLR
INTRAVENOUS | Status: AC
Start: 2024-06-25 — End: 2024-06-25
  Filled 2024-06-25: qty 1000

## 2024-06-25 MED ORDER — DROPERIDOL 2.5 MG/ML IJ SOLN: 0.6250 mg | Freq: Once | INTRAMUSCULAR | Status: DC | PRN

## 2024-06-25 MED ORDER — LABETALOL HCL 5 MG/ML IV SOLN
5.0000 mg | INTRAVENOUS | Status: DC | PRN
  Administered 2024-06-25 (×2): 5 mg via INTRAVENOUS

## 2024-06-25 MED ORDER — PROPOFOL 1000 MG/100ML IV EMUL
INTRAVENOUS | Status: AC
  Filled 2024-06-25: qty 100

## 2024-06-25 MED ORDER — PHENYLEPHRINE HCL-NACL 20-0.9 MG/250ML-% IV SOLN
INTRAVENOUS | Status: AC
  Filled 2024-06-25: qty 250

## 2024-06-25 MED ORDER — MIDAZOLAM HCL 2 MG/2ML IJ SOLN
INTRAMUSCULAR | Status: AC
  Filled 2024-06-25: qty 2

## 2024-06-25 MED ORDER — ACETAMINOPHEN 10 MG/ML IV SOLN
INTRAVENOUS | Status: DC | PRN
Start: 2024-06-25 — End: 2024-06-25
  Administered 2024-06-25 (×2): 1000 mg via INTRAVENOUS

## 2024-06-25 MED ORDER — REMIFENTANIL HCL 1 MG IV SOLR
INTRAVENOUS | Status: AC
  Filled 2024-06-25: qty 1000

## 2024-06-25 MED ORDER — 0.9 % SODIUM CHLORIDE (POUR BTL) OPTIME
TOPICAL | Status: DC | PRN
  Administered 2024-06-25 (×2): 500 mL

## 2024-06-25 MED ORDER — FENTANYL CITRATE (PF) 100 MCG/2ML IJ SOLN
INTRAMUSCULAR | Status: AC
  Filled 2024-06-25: qty 2

## 2024-06-25 MED ORDER — SODIUM CHLORIDE (PF) 0.9 % IJ SOLN
INTRAMUSCULAR | Status: DC | PRN
  Administered 2024-06-25 (×2): 60 mL via INTRAMUSCULAR

## 2024-06-25 MED ORDER — LIDOCAINE HCL (CARDIAC) PF 100 MG/5ML IV SOSY
PREFILLED_SYRINGE | INTRAVENOUS | Status: DC | PRN
Start: 2024-06-25 — End: 2024-06-25
  Administered 2024-06-25 (×2): 80 mg via INTRAVENOUS

## 2024-06-25 MED ORDER — KETAMINE HCL 50 MG/5ML IJ SOSY
PREFILLED_SYRINGE | INTRAMUSCULAR | Status: AC
  Filled 2024-06-25: qty 5

## 2024-06-25 MED ORDER — MIDAZOLAM HCL 2 MG/2ML IJ SOLN
INTRAMUSCULAR | Status: DC | PRN
Start: 2024-06-25 — End: 2024-06-25
  Administered 2024-06-25 (×2): 2 mg via INTRAVENOUS

## 2024-06-25 MED ORDER — SODIUM CHLORIDE (PF) 0.9 % IJ SOLN
INTRAMUSCULAR | Status: AC
Start: 2024-06-25 — End: 2024-06-25
  Filled 2024-06-25: qty 20

## 2024-06-25 MED ORDER — FENTANYL CITRATE (PF) 100 MCG/2ML IJ SOLN
INTRAMUSCULAR | Status: AC
Start: 2024-06-25 — End: 2024-06-25
  Filled 2024-06-25: qty 2

## 2024-06-25 MED ORDER — KETAMINE HCL 50 MG/5ML IJ SOSY
PREFILLED_SYRINGE | INTRAMUSCULAR | Status: DC | PRN
  Administered 2024-06-25 (×2): 30 mg via INTRAVENOUS

## 2024-06-25 MED ORDER — ACETAMINOPHEN 10 MG/ML IV SOLN: 1000.0000 mg | Freq: Once | INTRAVENOUS | Status: DC | PRN

## 2024-06-25 MED ORDER — ACETAMINOPHEN 10 MG/ML IV SOLN
INTRAVENOUS | Status: AC
  Filled 2024-06-25: qty 100

## 2024-06-25 MED ORDER — REMIFENTANIL HCL 1 MG IV SOLR
INTRAVENOUS | Status: DC | PRN
  Administered 2024-06-25 (×2): .2 ug/kg/min via INTRAVENOUS

## 2024-06-25 MED ORDER — OXYCODONE HCL 5 MG/5ML PO SOLN: 5.0000 mg | Freq: Once | ORAL | Status: AC | PRN

## 2024-06-25 MED ORDER — BUPIVACAINE-EPINEPHRINE (PF) 0.5% -1:200000 IJ SOLN
INTRAMUSCULAR | Status: DC | PRN
  Administered 2024-06-25 (×2): 6 mL

## 2024-06-25 MED ORDER — FENTANYL CITRATE (PF) 100 MCG/2ML IJ SOLN
INTRAMUSCULAR | Status: DC | PRN
  Administered 2024-06-25 (×2): 25 ug via INTRAVENOUS
  Administered 2024-06-25: 50 ug via INTRAVENOUS
  Administered 2024-06-25: 25 ug via INTRAVENOUS
  Administered 2024-06-25: 50 ug via INTRAVENOUS
  Administered 2024-06-25: 25 ug via INTRAVENOUS

## 2024-06-25 MED ORDER — METHYLPREDNISOLONE ACETATE 40 MG/ML IJ SUSP
INTRAMUSCULAR | Status: DC | PRN
  Administered 2024-06-25 (×2): 40 mg

## 2024-06-25 MED ORDER — OXYCODONE HCL 5 MG PO TABS
ORAL_TABLET | ORAL | Status: AC
  Filled 2024-06-25: qty 1

## 2024-06-25 MED ORDER — LACTATED RINGERS IV SOLN: INTRAVENOUS | Status: DC | PRN

## 2024-06-25 MED ORDER — CHLORHEXIDINE GLUCONATE 0.12 % MT SOLN
OROMUCOSAL | Status: AC
  Filled 2024-06-25: qty 15

## 2024-06-25 MED ORDER — SENNA 8.6 MG PO TABS
1.0000 | ORAL_TABLET | Freq: Two times a day (BID) | ORAL | 0 refills | Status: DC | PRN
Start: 2024-06-25 — End: 2024-07-10
  Filled 2024-06-25: qty 30, 15d supply, fill #0

## 2024-06-25 MED ORDER — SUCCINYLCHOLINE CHLORIDE 200 MG/10ML IV SOSY
PREFILLED_SYRINGE | INTRAVENOUS | Status: DC | PRN
Start: 2024-06-25 — End: 2024-06-25
  Administered 2024-06-25 (×2): 100 mg via INTRAVENOUS

## 2024-06-25 MED ORDER — SURGIFLO WITH THROMBIN (HEMOSTATIC MATRIX KIT) OPTIME
TOPICAL | Status: DC | PRN
  Administered 2024-06-25 (×2): 1 via TOPICAL

## 2024-06-25 MED ORDER — BUPIVACAINE LIPOSOME 1.3 % IJ SUSP
INTRAMUSCULAR | Status: AC
  Filled 2024-06-25: qty 20

## 2024-06-25 MED ORDER — SUCCINYLCHOLINE CHLORIDE 200 MG/10ML IV SOSY
PREFILLED_SYRINGE | INTRAVENOUS | Status: AC
  Filled 2024-06-25: qty 10

## 2024-06-25 MED ORDER — DEXAMETHASONE SODIUM PHOSPHATE 10 MG/ML IJ SOLN
INTRAMUSCULAR | Status: AC
  Filled 2024-06-25: qty 1

## 2024-06-25 MED ORDER — PHENYLEPHRINE HCL-NACL 20-0.9 MG/250ML-% IV SOLN
INTRAVENOUS | Status: DC | PRN
  Administered 2024-06-25 (×2): 40 ug/min via INTRAVENOUS

## 2024-06-25 MED ORDER — LABETALOL HCL 5 MG/ML IV SOLN
INTRAVENOUS | Status: AC
  Filled 2024-06-25: qty 4

## 2024-06-25 MED ORDER — PROPOFOL 10 MG/ML IV BOLUS
INTRAVENOUS | Status: AC
Start: 2024-06-25 — End: 2024-06-25
  Filled 2024-06-25: qty 40

## 2024-06-25 MED ORDER — GLYCOPYRROLATE 0.2 MG/ML IJ SOLN
INTRAMUSCULAR | Status: DC | PRN
  Administered 2024-06-25 (×2): .1 mg via INTRAVENOUS

## 2024-06-25 MED ORDER — REMIFENTANIL HCL 1 MG IV SOLR: INTRAVENOUS | Status: DC | PRN

## 2024-06-25 MED ORDER — PROPOFOL 10 MG/ML IV BOLUS
INTRAVENOUS | Status: DC | PRN
  Administered 2024-06-25 (×2): 140 mg via INTRAVENOUS

## 2024-06-25 MED ORDER — LIDOCAINE HCL (PF) 2 % IJ SOLN
INTRAMUSCULAR | Status: AC
  Filled 2024-06-25: qty 5

## 2024-06-25 MED ORDER — METHOCARBAMOL 500 MG PO TABS
500.0000 mg | ORAL_TABLET | Freq: Four times a day (QID) | ORAL | 0 refills | Status: DC
  Filled 2024-06-25: qty 120, 30d supply, fill #0

## 2024-06-25 MED ORDER — PHENYLEPHRINE 80 MCG/ML (10ML) SYRINGE FOR IV PUSH (FOR BLOOD PRESSURE SUPPORT)
PREFILLED_SYRINGE | INTRAVENOUS | Status: DC | PRN
Start: 2024-06-25 — End: 2024-06-25
  Administered 2024-06-25 (×4): 80 ug via INTRAVENOUS

## 2024-06-25 MED ORDER — DEXAMETHASONE SODIUM PHOSPHATE 10 MG/ML IJ SOLN
INTRAMUSCULAR | Status: DC | PRN
Start: 2024-06-25 — End: 2024-06-25
  Administered 2024-06-25 (×2): 10 mg via INTRAVENOUS

## 2024-06-25 MED ORDER — ONDANSETRON HCL 4 MG/2ML IJ SOLN
INTRAMUSCULAR | Status: AC
  Filled 2024-06-25: qty 2

## 2024-06-25 MED ORDER — CEFAZOLIN SODIUM-DEXTROSE 2-4 GM/100ML-% IV SOLN
INTRAVENOUS | Status: AC
  Filled 2024-06-25: qty 100

## 2024-06-25 MED ORDER — BUPIVACAINE HCL (PF) 0.5 % IJ SOLN
INTRAMUSCULAR | Status: AC
  Filled 2024-06-25: qty 30

## 2024-06-25 MED ORDER — PROPOFOL 500 MG/50ML IV EMUL
INTRAVENOUS | Status: DC | PRN
  Administered 2024-06-25 (×2): 125 ug/kg/min via INTRAVENOUS

## 2024-06-25 MED ORDER — OXYCODONE HCL 5 MG PO TABS
5.0000 mg | ORAL_TABLET | ORAL | 0 refills | Status: AC | PRN
  Filled 2024-06-25: qty 30, 5d supply, fill #0

## 2024-06-25 MED ORDER — OXYCODONE HCL 5 MG PO TABS
5.0000 mg | ORAL_TABLET | Freq: Once | ORAL | Status: AC | PRN
  Administered 2024-06-25 (×2): 5 mg via ORAL

## 2024-06-25 MED ORDER — BUPIVACAINE-EPINEPHRINE (PF) 0.5% -1:200000 IJ SOLN
INTRAMUSCULAR | Status: AC
  Filled 2024-06-25: qty 10

## 2024-06-25 MED ORDER — FENTANYL CITRATE (PF) 100 MCG/2ML IJ SOLN
25.0000 ug | INTRAMUSCULAR | Status: AC | PRN
  Administered 2024-06-25 (×16): 25 ug via INTRAVENOUS

## 2024-06-25 SURGICAL SUPPLY — 27 items
BASIN KIT SINGLE STR (MISCELLANEOUS) ×1 IMPLANT
BUR NEURO DRILL SOFT 3.0X3.8M (BURR) ×1 IMPLANT
DERMABOND ADVANCED .7 DNX12 (GAUZE/BANDAGES/DRESSINGS) ×1 IMPLANT
DRAPE C ARM PK CFD 31 SPINE (DRAPES) ×1 IMPLANT
DRAPE LAPAROTOMY 100X77 ABD (DRAPES) ×1 IMPLANT
DRAPE MICROSCOPE SPINE 48X150 (DRAPES) ×1 IMPLANT
DRSG OPSITE POSTOP 3X4 (GAUZE/BANDAGES/DRESSINGS) ×1 IMPLANT
ELECTRODE EZSTD 165MM 6.5IN (MISCELLANEOUS) ×1 IMPLANT
ELECTRODE REM PT RTRN 9FT ADLT (ELECTROSURGICAL) ×1 IMPLANT
GLOVE BIOGEL PI IND STRL 6.5 (GLOVE) ×1 IMPLANT
GLOVE SURG SYN 6.5 PF PI (GLOVE) ×1 IMPLANT
GLOVE SURG SYN 8.5 PF PI (GLOVE) ×3 IMPLANT
GOWN SRG LRG LVL 4 IMPRV REINF (GOWNS) ×1 IMPLANT
GOWN SRG XL LVL 3 NONREINFORCE (GOWNS) ×1 IMPLANT
KIT SPINAL PRONEVIEW (KITS) ×1 IMPLANT
MANIFOLD NEPTUNE II (INSTRUMENTS) ×1 IMPLANT
NDL SAFETY ECLIPSE 18X1.5 (NEEDLE) ×1 IMPLANT
NS IRRIG 500ML POUR BTL (IV SOLUTION) ×1 IMPLANT
PACK LAMINECTOMY ARMC (PACKS) ×1 IMPLANT
PAD ARMBOARD POSITIONER FOAM (MISCELLANEOUS) ×1 IMPLANT
SURGIFLO W/THROMBIN 8M KIT (HEMOSTASIS) ×1 IMPLANT
SUT STRATA 3-0 15 PS-2 (SUTURE) ×1 IMPLANT
SUT VIC AB 0 CT1 27XCR 8 STRN (SUTURE) ×1 IMPLANT
SUT VIC AB 2-0 CT1 18 (SUTURE) ×1 IMPLANT
SYR 30ML LL (SYRINGE) ×2 IMPLANT
SYR 3ML LL SCALE MARK (SYRINGE) ×1 IMPLANT
TRAP FLUID SMOKE EVACUATOR (MISCELLANEOUS) ×1 IMPLANT

## 2024-06-25 NOTE — Discharge Instructions (Signed)

## 2024-06-25 NOTE — Discharge Summary (Signed)
 Discharge Summary  Patient ID: Randall Hunter MRN: [id number] DOB/AGE: [birth year] 63 y.o.  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 *   Discharged Condition: good  Hospital Course:  Randall Hunter is a 63 year old presenting with lumbar stenosis and neurogenic claudication status post L2-3 lumbar decompression.  His intraoperative course was uncomplicated.  He was monitored in PACU and discharged home after ambulating, urinating, and tolerating p.o. intake.  He was given prescriptions for oxycodone , Robaxin , and senna to take as needed.  Consults: None  Significant Diagnostic Studies: NA  Treatments: surgery: As above.  Please see separately dictated operative report for further details.  Discharge Exam: Blood pressure (!) 153/90, pulse 85, temperature 97.9 F (36.6 C), temperature source Temporal, resp. rate 18, height 6' 2 (1.88 m), weight 88.5 kg, SpO2 99%. CN grossly intact MAEW Incision c/d/I with dermabond in place  Disposition: Discharge disposition: 01-Home or Self Care        Allergies as of 06/25/2024   No Known Allergies      Medication List     PAUSE taking these medications    aspirin 81 MG chewable tablet Wait to take this until your doctor or other care provider tells you to start again. Chew 81 mg by mouth daily.       STOP taking these medications    cyclobenzaprine  10 MG tablet Commonly known as: FLEXERIL    oxyCODONE -acetaminophen  5-325 MG tablet Commonly known as: Percocet   traMADol 50 MG tablet Commonly known as: ULTRAM       TAKE these medications    acetaminophen  500 MG tablet Commonly known as: TYLENOL  Take 500 mg by mouth daily as needed for moderate pain or headache.   amLODipine  10 MG tablet Commonly known  as: NORVASC  Take 10 mg by mouth every evening.   atorvastatin  20 MG tablet Commonly known as: LIPITOR Take 20 mg by mouth every evening.   B-12 2500 MCG Subl Place 2,500 mcg under the tongue every Sunday.   buPROPion  150 MG 24 hr tablet Commonly known as: WELLBUTRIN  XL Take 150 mg by mouth daily.   famotidine 40 MG tablet Commonly known as: PEPCID Take 40 mg by mouth daily.   loratadine  10 MG tablet Commonly known as: CLARITIN  Take 10 mg by mouth daily.   methocarbamol  500 MG tablet Commonly known as: ROBAXIN  Take 1 tablet (500 mg total) by mouth 4 (four) times daily.   olmesartan 20 MG tablet Commonly known as: BENICAR Take 20 mg by mouth daily.   oxyCODONE  5 MG immediate release tablet Commonly known as: Roxicodone  Take 1 tablet (5 mg total) by mouth every 4 (four) hours as needed for up to 5 days.   pantoprazole  40 MG tablet Commonly known as: PROTONIX  Take 40 mg by mouth every evening.   PARoxetine  20 MG tablet Commonly known as: PAXIL  Take 20 mg by mouth daily.   senna 8.6 MG Tabs tablet Commonly known as: SENOKOT Take 1 tablet (8.6 mg total) by mouth 2 (two) times daily as needed.         Signed: Rachel Samples 06/25/2024, 8:56 AM

## 2024-06-25 NOTE — Interval H&P Note (Signed)
 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  The various methods of treatment have been discussed with the patient and family. After consideration of risks, benefits and other options for treatment, the patient has consented to  Procedure(s) with comments: LUMBAR LAMINECTOMY/DECOMPRESSION MICRODISCECTOMY 1 LEVEL (N/A) - L3-4 DECOMPRESSION as a surgical intervention.  The patient's history has been reviewed, patient examined, no change in status, stable for surgery.  I have reviewed the patient's chart and labs.  Questions were answered to the patient's satisfaction.    Heart sounds normal no MRG. Chest Clear to Auscultation Bilaterally.   Burch Marchuk

## 2024-06-25 NOTE — Op Note (Signed)
 Indications: [name] is suffering from F51.937 Spinal stenosis, lumbar region, with neurogenic claudication, G83.4 Cauda equina compression.  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   Preoperative Note:  Risks of surgery discussed include: infection, bleeding, stroke, coma, death, paralysis, CSF leak, nerve/spinal cord injury, numbness, tingling, weakness, complex regional pain syndrome, recurrent stenosis and/or disc herniation, vascular injury, development of instability, neck/back pain, need for further surgery, persistent symptoms, development of deformity, and the risks of anesthesia. The patient understood these risks and agreed to proceed.  Operative Note:   1. L3-4 lumbar decompression including central laminectomy and bilateral medial facetectomies including foraminotomies  The patient was then brought from the preoperative center with intravenous access established.  The patient underwent general anesthesia and endotracheal tube intubation, and was then rotated on the [place] rail top where all pressure points were appropriately padded.  The skin was then thoroughly cleansed.  Perioperative antibiotic prophylaxis was administered.  Sterile prep and drapes were then applied and a timeout was then observed.  C-arm was brought into the field under sterile conditions and under lateral visualization the L3-4 interspace was identified and marked.  The incision was marked on the left and injected with local anesthetic. Once this was complete a 2 cm incision was opened with the use of a #10 blade knife.    The metrx tubes were sequentially advanced and confirmed in position at L3-4. An 18mm by  40mm tube was locked in place to the bed side attachment.  The microscope was then sterilely brought into the field and muscle creep was hemostased with a bipolar and resected with a pituitary rongeur.  A Bovie extender was then used to expose the spinous process and lamina.  Careful attention was placed to not violate the facet capsule. A 3 mm matchstick drill bit was then used to make a hemi-laminotomy trough until the ligamentum flavum was exposed.  This was extended to the base of the spinous process and to the contralateral side to remove all the central bone from each side.  Once this was complete and the underlying ligamentum flavum was visualized, it was dissected with a curette and resected with Kerrison rongeurs.  Extensive ligamentum hypertrophy was noted, requiring a substantial amount of time and care for removal.  The dura was identified and palpated. The kerrison rongeur was then used to remove the medial facet bilaterally until no compression was noted. A small synovial cyst was resected. A balltip probe was used to confirm decompression of the ipsilateral L4 nerve root.  Additional attention was paid to completion of the contralateral L3-4 foraminotomy until the contralateral traversing nerve root was completely free.  Once this was complete, L3-4 central decompression including medial facetectomy and foraminotomy was confirmed and decompression on both sides was confirmed. No CSF leak was noted.  Depo-Medrol  was placed on the nerve root.  The wound was copiously irrigated. The tube system was then removed under microscopic visualization and hemostasis was obtained with a bipolar.    The fascial layer was reapproximated with the use of a 0 Vicryl suture.  Subcutaneous tissue layer was reapproximated using 2-0 Vicryl suture.  3-0 monocryl was placed in subcuticular fashion. The skin was then cleansed and Dermabond was used to close the skin opening.  Patient was then rotated back to the  preoperative bed awakened from anesthesia and taken to recovery all counts are correct in this case.  I performed the entire procedure with the assistance of Edsel Goods PA as an Designer, television/film set. An assistant was required for this procedure due to the complexity.  The assistant provided assistance in tissue manipulation and suction, and was required for the successful and safe performance of the procedure. I performed the critical portions of the procedure.   Rafi Kenneth K. Clois MD

## 2024-06-25 NOTE — Transfer of Care (Signed)
 Immediate Anesthesia Transfer of Care Note  Patient: Randall Hunter  Procedure(s) Performed: LUMBAR LAMINECTOMY/DECOMPRESSION MICRODISCECTOMY 1 LEVEL (Spine Lumbar)  Patient Location: PACU  Anesthesia Type:General  Level of Consciousness: drowsy and patient cooperative  Airway & Oxygen Therapy: Patient Spontanous Breathing and Patient connected to face mask oxygen  Post-op Assessment: Report given to RN and Post -op Vital signs reviewed and stable  Post vital signs: Reviewed and stable  Last Vitals:  Vitals Value Taken Time  BP 158/82 06/25/24 09:11  Temp 36.5 C 06/25/24 09:09  Pulse 95 06/25/24 09:13  Resp 17 06/25/24 09:14  SpO2 100 % 06/25/24 09:13  Vitals shown include unfiled device data.  Last Pain:  Vitals:   06/25/24 0623  TempSrc: Temporal  PainSc: 7          Complications: No notable events documented.

## 2024-06-25 NOTE — Progress Notes (Signed)
 Patient able to ambulate, urinate and tolerate po intake without any difficulty.

## 2024-06-25 NOTE — Anesthesia Procedure Notes (Signed)
 Procedure Name: Intubation Date/Time: 06/25/2024 7:29 AM  Performed by: Blair Suzen BRAVO, RNPre-anesthesia Checklist: Patient identified, Emergency Drugs available, Suction available and Patient being monitored Patient Re-evaluated:Patient Re-evaluated prior to induction Oxygen Delivery Method: Circle System Utilized Preoxygenation: Pre-oxygenation with 100% oxygen Induction Type: IV induction Ventilation: Two handed mask ventilation required and Oral airway inserted - appropriate to patient size Laryngoscope Size: Glidescope and 4 Grade View: Grade II Tube type: Oral Number of attempts: 1 Airway Equipment and Method: Stylet and Oral airway Placement Confirmation: ETT inserted through vocal cords under direct vision, positive ETCO2 and breath sounds checked- equal and bilateral Secured at: 22 cm Tube secured with: Tape Dental Injury: Teeth and Oropharynx as per pre-operative assessment  Difficulty Due To: Difficult Airway- due to anterior larynx and Difficulty was anticipated Comments: Easy, atraumatic intubation. Lips and tongue unchanged. Head and neck midline. ETT placed through vocal cords with ease.

## 2024-06-27 ENCOUNTER — Encounter: Payer: Self-pay | Admitting: Neurosurgery

## 2024-07-01 ENCOUNTER — Telehealth: Admitting: Neurosurgery

## 2024-07-09 NOTE — Anesthesia Postprocedure Evaluation (Signed)
 Anesthesia Post Note  Patient: Randall Hunter  Procedure(s) Performed: LUMBAR LAMINECTOMY/DECOMPRESSION MICRODISCECTOMY 1 LEVEL (Spine Lumbar)  Patient location during evaluation: PACU Anesthesia Type: General Level of consciousness: awake and alert Pain management: pain level controlled Vital Signs Assessment: post-procedure vital signs reviewed and stable Respiratory status: spontaneous breathing, nonlabored ventilation, respiratory function stable and patient connected to nasal cannula oxygen Cardiovascular status: blood pressure returned to baseline and stable Postop Assessment: no apparent nausea or vomiting Anesthetic complications: no   No notable events documented.   Last Vitals:  Vitals:   06/25/24 1030 06/25/24 1036  BP: 116/65 121/82  Pulse: 85 73  Resp: 12 15  Temp: 36.7 C 36.6 C  SpO2: 94% 99%    Last Pain:  Vitals:   06/25/24 1036  TempSrc: Temporal  PainSc: 3                  Lynwood KANDICE Clause

## 2024-07-09 NOTE — Anesthesia Preprocedure Evaluation (Signed)
 Anesthesia Evaluation  Patient identified by MRN, date of birth, ID band Patient awake    Reviewed: Allergy & Precautions, H&P , NPO status , Patient's Chart, lab work & pertinent test results, reviewed documented beta blocker date and time   Airway Mallampati: II  TM Distance: >3 FB Neck ROM: full    Dental  (+) Teeth Intact   Pulmonary pneumonia   Pulmonary exam normal        Cardiovascular Exercise Tolerance: Poor hypertension, On Medications negative cardio ROS Normal cardiovascular exam Rhythm:regular Rate:Normal     Neuro/Psych  PSYCHIATRIC DISORDERS  Depression     Neuromuscular disease    GI/Hepatic Neg liver ROS,GERD  Medicated,,  Endo/Other  negative endocrine ROS    Renal/GU negative Renal ROS  negative genitourinary   Musculoskeletal   Abdominal   Peds  Hematology negative hematology ROS (+)   Anesthesia Other Findings Past Medical History: No date: Depression No date: Difficult intubation     Comment:  Very anterior and cephalad glottis.SABRAsuccessful with               macgraph and styletted ett. 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)     Comment:  lef sided oropharyngeal SCC s\p resection\radiation No date: Pneumonia Past Surgical History: No date: ADENOIDECTOMY No date: APPENDECTOMY 01/09/2018: CERVICAL DISC ARTHROPLASTY; Right     Comment:  Procedure: CERVICAL ANTERIOR DISC ARTHROPLASTY-C6-7;                Surgeon: Clois Fret, MD;  Location: [facility] ORS;                Service: Neurosurgery;  Laterality: Right; 06/25/2024: LUMBAR LAMINECTOMY/DECOMPRESSION MICRODISCECTOMY; N/A     Comment:  Procedure: LUMBAR LAMINECTOMY/DECOMPRESSION               MICRODISCECTOMY 1 LEVEL;  Surgeon:  Clois Fret,               MD;  Location: [facility] ORS;  Service: Neurosurgery;                Laterality: N/A;  L3-4 DECOMPRESSION 2002 and 2014: RADICAL NECK DISSECTION     Comment:  left cervical neck No date: TONSILLECTOMY BMI    Body Mass Index: 25.04 kg/m     Reproductive/Obstetrics negative OB ROS                              Anesthesia Physical Anesthesia Plan  ASA: 3  Anesthesia Plan: General ETT   Post-op Pain Management:    Induction:   PONV Risk Score and Plan:   Airway Management Planned:   Additional Equipment:   Intra-op Plan:   Post-operative Plan:   Informed Consent: I have reviewed the patients History and Physical, chart, labs and discussed the procedure including the risks, benefits and alternatives for the proposed anesthesia with the patient or authorized representative who has indicated his/her understanding and acceptance.     Dental Advisory Given  Plan Discussed with: CRNA  Anesthesia Plan Comments:         Anesthesia Quick Evaluation

## 2024-07-10 ENCOUNTER — Ambulatory Visit (INDEPENDENT_AMBULATORY_CARE_PROVIDER_SITE_OTHER): Admitting: Neurosurgery

## 2024-07-10 ENCOUNTER — Encounter: Payer: Self-pay | Admitting: Neurosurgery

## 2024-07-10 VITALS — BP 128/78 | Temp 98.2°F | Ht 74.0 in | Wt 195.0 lb

## 2024-07-10 DIAGNOSIS — M48062 Spinal stenosis, lumbar region with neurogenic claudication: Secondary | ICD-10-CM

## 2024-07-10 DIAGNOSIS — Z9889 Other specified postprocedural states: Secondary | ICD-10-CM

## 2024-07-10 NOTE — Progress Notes (Signed)
   REFERRING PHYSICIAN:  Cleotilde Oneil FALCON, Md [address] [facility] [place],  [place] [address]  DOS: L3-4 decompression   HISTORY OF PRESENT ILLNESS: Randall Hunter is approximately 2 weeks status post lumbar decompression. he is doing well. He states his legs are feeling great and denies any significant incision concerns. He is not currently taking any medications for pain.   PHYSICAL EXAMINATION:  General: Patient is well developed, well nourished, calm, collected, and in no apparent distress.   NEUROLOGICAL:  General: In no acute distress.   Awake, alert, oriented to person, place, and time.  Pupils equal round and reactive to light.  Facial tone is symmetric.  Tongue protrusion is midline.  There is no pronator drift.   Strength:            Side Iliopsoas Quads Hamstring PF DF EHL  R 5 5 5 5 5 5   L 4+ 4+ 5 5 5 5    Incision c/d/I and healing well   ROS (Neurologic):  Negative except as noted above  IMAGING: No interval imaging to review  ASSESSMENT/PLAN:  DERREL MOORE is doing well approximately 2 weeks after lumbar decompression. We discussed activity escalation and I have advised the patient to lift up to 10 pounds until 6 weeks after surgery, then increase up to 25 pounds until 12 weeks after surgery.  After 12 weeks post-op, the patient advised to increase activity as tolerated. he will follow up in 4 weeks with Dr. Clois or sooner should he have any questions or concerns.   Advised to contact the office if any questions or concerns arise.  Edsel Goods PA-C Department of neurosurgery

## 2024-07-29 ENCOUNTER — Telehealth: Payer: Self-pay

## 2024-07-29 ENCOUNTER — Ambulatory Visit (INDEPENDENT_AMBULATORY_CARE_PROVIDER_SITE_OTHER): Admitting: Neurosurgery

## 2024-07-29 ENCOUNTER — Encounter: Payer: Self-pay | Admitting: Neurosurgery

## 2024-07-29 VITALS — BP 146/100 | Temp 98.4°F | Ht 74.0 in | Wt 201.0 lb

## 2024-07-29 DIAGNOSIS — M48062 Spinal stenosis, lumbar region with neurogenic claudication: Secondary | ICD-10-CM

## 2024-07-29 DIAGNOSIS — Z9889 Other specified postprocedural states: Secondary | ICD-10-CM

## 2024-07-29 NOTE — Progress Notes (Signed)
   REFERRING PHYSICIAN:  Cleotilde Oneil FALCON, Md [address] [facility] [place],  [place] [address]  DOS: L3-4 decompression   HISTORY OF PRESENT ILLNESS: Randall Hunter is status post lumbar decompression. he is doing well.  He had some posterior thigh pain last week and started some steroids which have helped.  Otherwise he is doing very well.     PHYSICAL EXAMINATION:  General: Patient is well developed, well nourished, calm, collected, and in no apparent distress.   NEUROLOGICAL:  General: In no acute distress.   Awake, alert, oriented to person, place, and time.  Pupils equal round and reactive to light.  Facial tone is symmetric.  Tongue protrusion is midline.  There is no pronator drift.   Strength:            Side Iliopsoas Quads Hamstring PF DF EHL  R 5 5 5 5 5 5   L 5 5 5 5 5 5    Incision c/d/I and healing well   ROS (Neurologic):  Negative except as noted above  IMAGING: No interval imaging to review  ASSESSMENT/PLAN:  JOSHUWA VECCHIO is doing well.  We discussed activity limitations.  We will see him back in 6 weeks.  I am very optimistic that he will have an excellent outcome.     Reeves Daisy MD Department of neurosurgery

## 2024-07-29 NOTE — Telephone Encounter (Signed)
 Patient spoke with their primary care provider, who suspects tightness in the hamstring muscles may be contributing to the discomfort. A 14-day course of prednisone  10 mg tablets was prescribed. The patient reports that the initial doses have significantly reduced the pain and is now considering canceling or rescheduling the upcoming appointment. They are wanting to get advice on what to do.

## 2024-09-18 ENCOUNTER — Ambulatory Visit (INDEPENDENT_AMBULATORY_CARE_PROVIDER_SITE_OTHER): Admitting: Neurosurgery

## 2024-09-18 VITALS — BP 110/78 | Wt 208.0 lb

## 2024-09-18 DIAGNOSIS — M48062 Spinal stenosis, lumbar region with neurogenic claudication: Secondary | ICD-10-CM

## 2024-09-18 DIAGNOSIS — Z9889 Other specified postprocedural states: Secondary | ICD-10-CM

## 2024-09-18 NOTE — Progress Notes (Signed)
   REFERRING PHYSICIAN:  Cleotilde Oneil FALCON, Md [address] [facility] [place],  [place] [address]  DOS: L3-4 decompression   HISTORY OF PRESENT ILLNESS:  09/18/24 Randall Hunter is a 63 y.o presenting today for post-op follow up from lumbar decompression. He is about 2.5 months out from surgery. He denies any pain and is very pleased with his postoperative outcome thus far. He has resumed most of his activity without trouble.   07/29/24 Randall Hunter is status post lumbar decompression. he is doing well.  He had some posterior thigh pain last week and started some steroids which have helped.  Otherwise he is doing very well.     PHYSICAL EXAMINATION:  General: Patient is well developed, well nourished, calm, collected, and in no apparent distress.   NEUROLOGICAL:  General: In no acute distress.   Awake, alert, oriented to person, place, and time.  Pupils equal round and reactive to light.    Strength:            Side Iliopsoas Quads Hamstring PF DF EHL  R 5 5 5 5 5 5   L 5 5 5 5 5 5    Incision is well healed  ROS (Neurologic):  Negative except as noted above  IMAGING: No interval imaging to review  ASSESSMENT/PLAN:  Randall Hunter is doing well.  He is very pleased with his post-op recovery thus far. He plans to wait until the spring to resume golfing. We will see him going forward on an as needed basis. He was encouraged to call the office with any questions or concerns.   Edsel Goods PA-C Department of neurosurgery
